# Patient Record
Sex: Female | Born: 1955 | Race: White | Hispanic: No | Marital: Single | Smoking: Never smoker
Health system: Southern US, Community
[De-identification: ages and names within clinical notes are randomized; demographics above are authoritative.]

## PROBLEM LIST (undated history)

## (undated) DIAGNOSIS — Z789 Other specified health status: Secondary | ICD-10-CM

## (undated) DIAGNOSIS — M1990 Unspecified osteoarthritis, unspecified site: Secondary | ICD-10-CM

## (undated) DIAGNOSIS — M8580 Other specified disorders of bone density and structure, unspecified site: Secondary | ICD-10-CM

## (undated) DIAGNOSIS — K635 Polyp of colon: Secondary | ICD-10-CM

## (undated) HISTORY — PX: WISDOM TOOTH EXTRACTION: SHX21

## (undated) HISTORY — PX: JOINT REPLACEMENT: SHX530

## (undated) HISTORY — PX: COLONOSCOPY W/ POLYPECTOMY: SHX1380

---

## 2003-04-15 ENCOUNTER — Other Ambulatory Visit: Admission: RE | Admit: 2003-04-15 | Discharge: 2003-04-15 | Payer: Self-pay | Admitting: Obstetrics and Gynecology

## 2004-06-19 ENCOUNTER — Other Ambulatory Visit: Admission: RE | Admit: 2004-06-19 | Discharge: 2004-06-19 | Payer: Self-pay | Admitting: Obstetrics and Gynecology

## 2005-09-21 ENCOUNTER — Other Ambulatory Visit: Admission: RE | Admit: 2005-09-21 | Discharge: 2005-09-21 | Payer: Self-pay | Admitting: Obstetrics and Gynecology

## 2008-08-10 ENCOUNTER — Ambulatory Visit: Payer: Self-pay | Admitting: Unknown Physician Specialty

## 2009-09-27 ENCOUNTER — Ambulatory Visit: Payer: Self-pay | Admitting: Unknown Physician Specialty

## 2018-09-05 DIAGNOSIS — M8580 Other specified disorders of bone density and structure, unspecified site: Secondary | ICD-10-CM | POA: Insufficient documentation

## 2018-10-04 HISTORY — PX: COLONOSCOPY WITH PROPOFOL: SHX5780

## 2019-02-23 DIAGNOSIS — M1611 Unilateral primary osteoarthritis, right hip: Secondary | ICD-10-CM | POA: Insufficient documentation

## 2019-02-23 DIAGNOSIS — M160 Bilateral primary osteoarthritis of hip: Secondary | ICD-10-CM | POA: Insufficient documentation

## 2019-04-18 NOTE — Discharge Instructions (Signed)
Instructions after Total Hip Replacement ° ° °  Mosi Hannold P. Eliel Dudding, Jr., M.D.    ° Dept. of Orthopaedics & Sports Medicine ° [facility] ° [address] ° [place], [place]  [address] ° Phone: [phone]   Fax: [phone] ° °  °DIET: °• Drink plenty of non-alcoholic fluids. °• Resume your normal diet. Include foods high in fiber. ° °ACTIVITY:  °• You may use crutches or a walker with weight-bearing as tolerated, unless instructed otherwise. °• You may be weaned off of the walker or crutches by your Physical Therapist.  °• Do NOT reach below the level of your knees or cross your legs until allowed.    °• Continue doing gentle exercises. Exercising will reduce the pain and swelling, increase motion, and prevent muscle weakness.   °• Please continue to use the TED compression stockings for 6 weeks. You may remove the stockings at night, but should reapply them in the morning. °• Do not drive or operate any equipment until instructed. ° °WOUND CARE:  °• Continue to use ice packs periodically to reduce pain and swelling. °• Keep the incision clean and dry. °• You may bathe or shower after the staples are removed at the first office visit following surgery. ° °MEDICATIONS: °• You may resume your regular medications. °• Please take the pain medication as prescribed on the medication. °• Do not take pain medication on an empty stomach. °• You have been given a prescription for a blood thinner to prevent blood clots. Please take the medication as instructed. (NOTE: After completing a 2 week course of Lovenox, take one Enteric-coated aspirin once a day.) °• Pain medications and iron supplements can cause constipation. Use a stool softener (Senokot or Colace) on a daily basis and a laxative (dulcolax or miralax) as needed. °• Do not drive or drink alcoholic beverages when taking pain medications. ° °CALL THE OFFICE FOR: °• Temperature above 101 degrees °• Excessive bleeding or drainage on the dressing. °• Excessive  swelling, coldness, or paleness of the toes. °• Persistent nausea and vomiting. ° °FOLLOW-UP:  °• You should have an appointment to return to the office in 6 weeks after surgery. °• Arrangements have been made for continuation of Physical Therapy (either home therapy or outpatient therapy). °  °

## 2019-04-29 ENCOUNTER — Encounter
Admission: RE | Admit: 2019-04-29 | Discharge: 2019-04-29 | Disposition: A | Discharge status: Home / Self Care | Payer: BC Managed Care – PPO | Source: Ambulatory Visit | Attending: Orthopedic Surgery | Admitting: Orthopedic Surgery

## 2019-04-29 ENCOUNTER — Other Ambulatory Visit: Payer: Self-pay

## 2019-04-29 DIAGNOSIS — Z01818 Encounter for other preprocedural examination: Secondary | ICD-10-CM | POA: Insufficient documentation

## 2019-04-29 LAB — URINALYSIS, ROUTINE W REFLEX MICROSCOPIC
Bacteria, UA: NONE SEEN
Bilirubin Urine: NEGATIVE
Glucose, UA: NEGATIVE mg/dL
Hgb urine dipstick: NEGATIVE
Ketones, ur: NEGATIVE mg/dL
Leukocytes,Ua: NEGATIVE
Nitrite: NEGATIVE
Protein, ur: NEGATIVE mg/dL
Specific Gravity, Urine: 1.016 (ref 1.005–1.030)
pH: 6 (ref 5.0–8.0)

## 2019-04-29 LAB — COMPREHENSIVE METABOLIC PANEL
ALT: 21 U/L (ref 0–44)
AST: 24 U/L (ref 15–41)
Albumin: 5 g/dL (ref 3.5–5.0)
Alkaline Phosphatase: 60 U/L (ref 38–126)
Anion gap: 9 (ref 5–15)
BUN: 18 mg/dL (ref 8–23)
CO2: 27 mmol/L (ref 22–32)
Calcium: 9.8 mg/dL (ref 8.9–10.3)
Chloride: 103 mmol/L (ref 98–111)
Creatinine, Ser: 0.76 mg/dL (ref 0.44–1.00)
GFR calc Af Amer: >60 mL/min (ref >60)
GFR calc non Af Amer: >60 mL/min (ref >60)
Glucose, Bld: 102 mg/dL — ABNORMAL HIGH (ref 70–99)
Potassium: 3.7 mmol/L (ref 3.5–5.1)
Sodium: 139 mmol/L (ref 135–145)
Total Bilirubin: 0.5 mg/dL (ref 0.3–1.2)
Total Protein: 7.7 g/dL (ref 6.5–8.1)

## 2019-04-29 LAB — PROTIME-INR
INR: 0.9 (ref 0.8–1.2)
Prothrombin Time: 12.2 seconds (ref 11.4–15.2)

## 2019-04-29 LAB — CBC WITH DIFFERENTIAL/PLATELET
Abs Immature Granulocytes: 0.03 10*3/uL (ref 0.00–0.07)
Basophils Absolute: 0.1 10*3/uL (ref 0.0–0.1)
Basophils Relative: 1 %
Eosinophils Absolute: 0 10*3/uL (ref 0.0–0.5)
Eosinophils Relative: 0 %
HCT: 42.4 % (ref 36.0–46.0)
Hemoglobin: 14 g/dL (ref 12.0–15.0)
Immature Granulocytes: 0 %
Lymphocytes Relative: 24 %
Lymphs Abs: 2.1 10*3/uL (ref 0.7–4.0)
MCH: 29.9 pg (ref 26.0–34.0)
MCHC: 33 g/dL (ref 30.0–36.0)
MCV: 90.6 fL (ref 80.0–100.0)
Monocytes Absolute: 0.5 10*3/uL (ref 0.1–1.0)
Monocytes Relative: 6 %
Neutro Abs: 5.9 10*3/uL (ref 1.7–7.7)
Neutrophils Relative %: 69 %
Platelets: 351 10*3/uL (ref 150–400)
RBC: 4.68 MIL/uL (ref 3.87–5.11)
RDW: 13.1 % (ref 11.5–15.5)
WBC: 8.7 10*3/uL (ref 4.0–10.5)
nRBC: 0 % (ref 0.0–0.2)

## 2019-04-29 LAB — SURGICAL PCR SCREEN
MRSA, PCR: NEGATIVE
Staphylococcus aureus: NEGATIVE

## 2019-04-29 LAB — TYPE AND SCREEN
ABO/RH(D): B POS
Antibody Screen: NEGATIVE

## 2019-04-29 LAB — APTT: aPTT: 31 seconds (ref 24–36)

## 2019-04-29 NOTE — Patient Instructions (Signed)
Your procedure is scheduled on: Wednesday 05/06/19.  Report to DAY SURGERY DEPARTMENT LOCATED ON 2ND FLOOR MEDICAL MALL ENTRANCE. To find out your arrival time please call [phone] between 1PM - 3PM on Tuesday 05/05/19.   Remember: Instructions that are not followed completely may result in serious medical risk, up to and including death, or upon the discretion of your surgeon and anesthesiologist your surgery may need to be rescheduled.      _X__ 1. Do not eat food after midnight the night before your procedure.                 No gum chewing or hard candies. You may drink clear liquids up to 2 hours                 before you are scheduled to arrive for your surgery- DO NOT drink clear                 liquids within 2 hours of the start of your surgery.                 Clear Liquids include:  water, apple juice without pulp, clear carbohydrate                 drink such as Clearfast or Gatorade, Black Coffee or Tea (Do not add                 Milk or creamer to coffee or tea).  **Please finish your Pre-Surgery Ensure 2 hours before your scheduled arrival time.    __X__2.  On the morning of surgery brush your teeth with toothpaste and water, you may rinse your mouth with mouthwash if you wish.  Do not swallow any toothpaste or mouthwash.      __X__3.  Notify your doctor if there is any change in your medical condition      (cold, fever, infections).     Do not wear jewelry, make-up, hairpins, clips or nail polish. Do not wear lotions, powders, or perfumes.  Do not shave 48 hours prior to surgery. Men may shave face and neck. Do not bring valuables to the hospital.    [facility] is not responsible for any belongings or valuables.  Contacts, dentures/partials or body piercings may not be worn into surgery. Bring a case for your contacts, glasses or hearing aids, a denture cup will be supplied.  For patients admitted to the hospital, discharge time is determined by your treatment  team.     Please read over the following fact sheets that you were given:   MRSA Information  __X__ Take these medicines the morning of surgery with A SIP OF WATER:     1. Tylenol if needed is okay to take on the morning of your surgery.  2.   3.   4.  5.  6.    __X__ Use CHG Soap as directed    __X__ Stop Anti-inflammatories 7 days before surgery such as Advil, Ibuprofen, Motrin, BC or Goodies Powder, Naprosyn, Naproxen, Aleve, Aspirin, Meloxicam. May take Tylenol if needed for pain or discomfort.     __X__ Please do not begin taking any herbal supplements until after your procedure.

## 2019-04-30 LAB — URINE CULTURE
Culture: NO GROWTH
Special Requests: NORMAL

## 2019-05-01 ENCOUNTER — Other Ambulatory Visit
Admission: RE | Admit: 2019-05-01 | Discharge: 2019-05-01 | Disposition: A | Discharge status: Home / Self Care | Payer: BC Managed Care – PPO | Source: Ambulatory Visit | Attending: Orthopedic Surgery | Admitting: Orthopedic Surgery

## 2019-05-01 ENCOUNTER — Other Ambulatory Visit: Payer: Self-pay

## 2019-05-01 DIAGNOSIS — Z01812 Encounter for preprocedural laboratory examination: Secondary | ICD-10-CM | POA: Diagnosis present

## 2019-05-01 DIAGNOSIS — Z20828 Contact with and (suspected) exposure to other viral communicable diseases: Secondary | ICD-10-CM | POA: Insufficient documentation

## 2019-05-01 LAB — SEDIMENTATION RATE: Sed Rate: 9 mm/hr (ref 0–30)

## 2019-05-01 LAB — SARS CORONAVIRUS 2 (TAT 6-24 HRS): SARS Coronavirus 2: NEGATIVE

## 2019-05-01 LAB — C-REACTIVE PROTEIN: CRP: <0.8 mg/dL (ref <1.0)

## 2019-05-05 ENCOUNTER — Encounter: Payer: Self-pay | Admitting: Orthopedic Surgery

## 2019-05-05 MED ORDER — TRANEXAMIC ACID-NACL 1000-0.7 MG/100ML-% IV SOLN
1000.0000 mg | INTRAVENOUS | Status: DC
  Administered 2019-05-06: 1000 mg via INTRAVENOUS
  Filled 2019-05-05: qty 100

## 2019-05-05 MED ORDER — CEFAZOLIN SODIUM-DEXTROSE 2-4 GM/100ML-% IV SOLN: 2.0000 g | INTRAVENOUS | Status: DC

## 2019-05-06 ENCOUNTER — Inpatient Hospital Stay: Payer: BC Managed Care – PPO | Admitting: Certified Registered"

## 2019-05-06 ENCOUNTER — Other Ambulatory Visit: Payer: Self-pay

## 2019-05-06 ENCOUNTER — Encounter
Admission: RE | Disposition: A | Discharge status: Home / Self Care | Payer: Self-pay | Source: Home / Self Care | Attending: Orthopedic Surgery

## 2019-05-06 ENCOUNTER — Observation Stay
Admission: RE | Admit: 2019-05-06 | Discharge: 2019-05-08 | Disposition: A | Discharge status: Home / Self Care | Payer: BC Managed Care – PPO | Attending: Orthopedic Surgery | Admitting: Orthopedic Surgery

## 2019-05-06 ENCOUNTER — Inpatient Hospital Stay: Payer: BC Managed Care – PPO

## 2019-05-06 DIAGNOSIS — M25552 Pain in left hip: Secondary | ICD-10-CM | POA: Diagnosis present

## 2019-05-06 DIAGNOSIS — Z96642 Presence of left artificial hip joint: Secondary | ICD-10-CM

## 2019-05-06 DIAGNOSIS — M1612 Unilateral primary osteoarthritis, left hip: Principal | ICD-10-CM | POA: Insufficient documentation

## 2019-05-06 DIAGNOSIS — Z96649 Presence of unspecified artificial hip joint: Secondary | ICD-10-CM

## 2019-05-06 HISTORY — PX: TOTAL HIP ARTHROPLASTY: SHX124

## 2019-05-06 LAB — ABO/RH: ABO/RH(D): B POS

## 2019-05-06 SURGERY — ARTHROPLASTY, HIP, TOTAL,POSTERIOR APPROACH
Anesthesia: Spinal | Site: Hip | Laterality: Left

## 2019-05-06 MED ORDER — FENTANYL CITRATE (PF) 100 MCG/2ML IJ SOLN
INTRAMUSCULAR | Status: AC
  Administered 2019-05-06: 25 ug via INTRAVENOUS
  Filled 2019-05-06: qty 2

## 2019-05-06 MED ORDER — CEFAZOLIN SODIUM-DEXTROSE 2-4 GM/100ML-% IV SOLN
2.0000 g | Freq: Four times a day (QID) | INTRAVENOUS | Status: DC
  Administered 2019-05-06 (×2): 2 g via INTRAVENOUS
  Filled 2019-05-06 (×2): qty 100

## 2019-05-06 MED ORDER — BSS IO SOLN: 15.0000 mL | Freq: Once | INTRAOCULAR | Status: DC

## 2019-05-06 MED ORDER — PROPOFOL 10 MG/ML IV BOLUS
INTRAVENOUS | Status: DC | PRN
  Administered 2019-05-06: 30 mg via INTRAVENOUS

## 2019-05-06 MED ORDER — OXYCODONE HCL 5 MG PO TABS
5.0000 mg | ORAL_TABLET | ORAL | Status: DC | PRN
  Administered 2019-05-07: 5 mg via ORAL
  Filled 2019-05-06 (×2): qty 1

## 2019-05-06 MED ORDER — PROPOFOL 500 MG/50ML IV EMUL
INTRAVENOUS | Status: AC
  Filled 2019-05-06: qty 50

## 2019-05-06 MED ORDER — MIDAZOLAM HCL 5 MG/5ML IJ SOLN
INTRAMUSCULAR | Status: DC | PRN
  Administered 2019-05-06 (×2): 1 mg via INTRAVENOUS

## 2019-05-06 MED ORDER — ADULT MULTIVITAMIN W/MINERALS CH
1.0000 | ORAL_TABLET | Freq: Every day | ORAL | Status: DC
  Administered 2019-05-07 – 2019-05-08 (×2): 1 via ORAL
  Filled 2019-05-06 (×2): qty 1

## 2019-05-06 MED ORDER — BISACODYL 10 MG RE SUPP
10.0000 mg | Freq: Every day | RECTAL | Status: DC | PRN
  Administered 2019-05-08: 10 mg via RECTAL
  Filled 2019-05-06 (×2): qty 1

## 2019-05-06 MED ORDER — GABAPENTIN 300 MG PO CAPS
ORAL_CAPSULE | ORAL | Status: AC
  Administered 2019-05-06: 300 mg via ORAL
  Filled 2019-05-06: qty 1

## 2019-05-06 MED ORDER — ONDANSETRON HCL 4 MG/2ML IJ SOLN: 4.0000 mg | Freq: Four times a day (QID) | INTRAMUSCULAR | Status: DC | PRN

## 2019-05-06 MED ORDER — CEFAZOLIN SODIUM-DEXTROSE 2-4 GM/100ML-% IV SOLN
INTRAVENOUS | Status: AC
  Filled 2019-05-06: qty 100

## 2019-05-06 MED ORDER — FAMOTIDINE 20 MG PO TABS
ORAL_TABLET | ORAL | Status: AC
  Administered 2019-05-06: 20 mg via ORAL
  Filled 2019-05-06: qty 1

## 2019-05-06 MED ORDER — ACETAMINOPHEN 10 MG/ML IV SOLN
1000.0000 mg | Freq: Four times a day (QID) | INTRAVENOUS | Status: AC
  Administered 2019-05-06 – 2019-05-07 (×4): 1000 mg via INTRAVENOUS
  Filled 2019-05-06 (×3): qty 100

## 2019-05-06 MED ORDER — ONDANSETRON HCL 4 MG PO TABS: 4.0000 mg | ORAL_TABLET | Freq: Four times a day (QID) | ORAL | Status: DC | PRN

## 2019-05-06 MED ORDER — FENTANYL CITRATE (PF) 100 MCG/2ML IJ SOLN
25.0000 ug | INTRAMUSCULAR | Status: DC | PRN
  Administered 2019-05-06 (×5): 25 ug via INTRAVENOUS

## 2019-05-06 MED ORDER — CELECOXIB 200 MG PO CAPS
400.0000 mg | ORAL_CAPSULE | Freq: Once | ORAL | Status: AC
  Administered 2019-05-06: 08:00:00 400 mg via ORAL

## 2019-05-06 MED ORDER — PROPOFOL 10 MG/ML IV BOLUS
INTRAVENOUS | Status: AC
  Filled 2019-05-06: qty 20

## 2019-05-06 MED ORDER — ENSURE PRE-SURGERY PO LIQD
296.0000 mL | Freq: Once | ORAL | Status: DC
  Filled 2019-05-06: qty 296

## 2019-05-06 MED ORDER — DEXAMETHASONE SODIUM PHOSPHATE 10 MG/ML IJ SOLN
INTRAMUSCULAR | Status: AC
  Administered 2019-05-06: 8 mg via INTRAVENOUS
  Filled 2019-05-06: qty 1

## 2019-05-06 MED ORDER — DEXAMETHASONE SODIUM PHOSPHATE 10 MG/ML IJ SOLN
8.0000 mg | Freq: Once | INTRAMUSCULAR | Status: AC
  Administered 2019-05-06: 08:00:00 8 mg via INTRAVENOUS

## 2019-05-06 MED ORDER — FLEET ENEMA 7-19 GM/118ML RE ENEM: 1.0000 | ENEMA | Freq: Once | RECTAL | Status: DC | PRN

## 2019-05-06 MED ORDER — LACTATED RINGERS IV SOLN
INTRAVENOUS | Status: DC
  Administered 2019-05-06: 08:00:00 via INTRAVENOUS

## 2019-05-06 MED ORDER — HYDROMORPHONE HCL 1 MG/ML IJ SOLN: 0.5000 mg | INTRAMUSCULAR | Status: DC | PRN

## 2019-05-06 MED ORDER — PHENOL 1.4 % MT LIQD
1.0000 | OROMUCOSAL | Status: DC | PRN
  Filled 2019-05-06: qty 177

## 2019-05-06 MED ORDER — DIPHENHYDRAMINE HCL 12.5 MG/5ML PO ELIX
12.5000 mg | ORAL_SOLUTION | ORAL | Status: DC | PRN
  Filled 2019-05-06: qty 10

## 2019-05-06 MED ORDER — BUPIVACAINE HCL (PF) 0.5 % IJ SOLN
INTRAMUSCULAR | Status: DC | PRN
  Administered 2019-05-06: 3 mL

## 2019-05-06 MED ORDER — CELECOXIB 200 MG PO CAPS
ORAL_CAPSULE | ORAL | Status: AC
  Administered 2019-05-06: 400 mg via ORAL
  Filled 2019-05-06: qty 2

## 2019-05-06 MED ORDER — TRANEXAMIC ACID-NACL 1000-0.7 MG/100ML-% IV SOLN
1000.0000 mg | Freq: Once | INTRAVENOUS | Status: AC
  Administered 2019-05-06: 09:00:00 1000 mg via INTRAVENOUS
  Filled 2019-05-06: qty 100

## 2019-05-06 MED ORDER — MAGNESIUM HYDROXIDE 400 MG/5ML PO SUSP
30.0000 mL | Freq: Every day | ORAL | Status: DC
  Administered 2019-05-07: 30 mL via ORAL
  Filled 2019-05-06 (×2): qty 30

## 2019-05-06 MED ORDER — ENOXAPARIN SODIUM 30 MG/0.3ML ~~LOC~~ SOLN
30.0000 mg | Freq: Two times a day (BID) | SUBCUTANEOUS | Status: DC
  Administered 2019-05-07 – 2019-05-08 (×3): 30 mg via SUBCUTANEOUS
  Filled 2019-05-06 (×3): qty 0.3

## 2019-05-06 MED ORDER — FAMOTIDINE 20 MG PO TABS
20.0000 mg | ORAL_TABLET | Freq: Once | ORAL | Status: AC
  Administered 2019-05-06: 08:00:00 20 mg via ORAL

## 2019-05-06 MED ORDER — MIDAZOLAM HCL 2 MG/2ML IJ SOLN
INTRAMUSCULAR | Status: AC
  Filled 2019-05-06: qty 2

## 2019-05-06 MED ORDER — CHLORHEXIDINE GLUCONATE 4 % EX LIQD: 60.0000 mL | Freq: Once | CUTANEOUS | Status: DC

## 2019-05-06 MED ORDER — SODIUM CHLORIDE 0.9 % IV SOLN
INTRAVENOUS | Status: DC
  Administered 2019-05-06 – 2019-05-07 (×2): via INTRAVENOUS

## 2019-05-06 MED ORDER — MENTHOL 3 MG MT LOZG
1.0000 | LOZENGE | OROMUCOSAL | Status: DC | PRN
  Filled 2019-05-06: qty 9

## 2019-05-06 MED ORDER — KETOROLAC TROMETHAMINE 0.5 % OP SOLN
1.0000 [drp] | Freq: Three times a day (TID) | OPHTHALMIC | Status: DC | PRN
  Filled 2019-05-06: qty 3

## 2019-05-06 MED ORDER — CEFAZOLIN SODIUM-DEXTROSE 2-4 GM/100ML-% IV SOLN
2.0000 g | Freq: Four times a day (QID) | INTRAVENOUS | Status: AC
  Administered 2019-05-06 – 2019-05-07 (×2): 2 g via INTRAVENOUS
  Filled 2019-05-06 (×3): qty 100

## 2019-05-06 MED ORDER — GABAPENTIN 300 MG PO CAPS
300.0000 mg | ORAL_CAPSULE | Freq: Once | ORAL | Status: AC
  Administered 2019-05-06: 08:00:00 300 mg via ORAL

## 2019-05-06 MED ORDER — CELECOXIB 200 MG PO CAPS
200.0000 mg | ORAL_CAPSULE | Freq: Two times a day (BID) | ORAL | Status: DC
  Administered 2019-05-06 – 2019-05-08 (×4): 200 mg via ORAL
  Filled 2019-05-06 (×4): qty 1

## 2019-05-06 MED ORDER — ALUM & MAG HYDROXIDE-SIMETH 200-200-20 MG/5ML PO SUSP: 30.0000 mL | ORAL | Status: DC | PRN

## 2019-05-06 MED ORDER — OXYCODONE HCL 5 MG PO TABS
10.0000 mg | ORAL_TABLET | ORAL | Status: DC | PRN
  Filled 2019-05-06: qty 2

## 2019-05-06 MED ORDER — FERROUS SULFATE 325 (65 FE) MG PO TABS
325.0000 mg | ORAL_TABLET | Freq: Two times a day (BID) | ORAL | Status: DC
  Administered 2019-05-07 – 2019-05-08 (×3): 325 mg via ORAL
  Filled 2019-05-06 (×3): qty 1

## 2019-05-06 MED ORDER — FENTANYL CITRATE (PF) 100 MCG/2ML IJ SOLN
INTRAMUSCULAR | Status: AC
  Filled 2019-05-06: qty 2

## 2019-05-06 MED ORDER — PANTOPRAZOLE SODIUM 40 MG PO TBEC
40.0000 mg | DELAYED_RELEASE_TABLET | Freq: Two times a day (BID) | ORAL | Status: DC
  Administered 2019-05-06 – 2019-05-08 (×4): 40 mg via ORAL
  Filled 2019-05-06 (×4): qty 1

## 2019-05-06 MED ORDER — ACETAMINOPHEN 10 MG/ML IV SOLN
INTRAVENOUS | Status: AC
  Administered 2019-05-06: 1000 mg via INTRAVENOUS
  Filled 2019-05-06: qty 100

## 2019-05-06 MED ORDER — METOCLOPRAMIDE HCL 5 MG/ML IJ SOLN: 5.0000 mg | Freq: Three times a day (TID) | INTRAMUSCULAR | Status: DC | PRN

## 2019-05-06 MED ORDER — KETOROLAC TROMETHAMINE 0.5 % OP SOLN
1.0000 [drp] | Freq: Three times a day (TID) | OPHTHALMIC | Status: AC | PRN
  Administered 2019-05-06 – 2019-05-07 (×3): 1 [drp] via OPHTHALMIC
  Filled 2019-05-06: qty 3

## 2019-05-06 MED ORDER — BUPIVACAINE HCL (PF) 0.5 % IJ SOLN
INTRAMUSCULAR | Status: AC
  Filled 2019-05-06: qty 10

## 2019-05-06 MED ORDER — NEOMYCIN-POLYMYXIN B GU 40-200000 IR SOLN
Status: DC | PRN
  Administered 2019-05-06: 16 mL

## 2019-05-06 MED ORDER — PHENYLEPHRINE HCL (PRESSORS) 10 MG/ML IV SOLN
INTRAVENOUS | Status: DC | PRN
  Administered 2019-05-06 (×2): 100 ug via INTRAVENOUS

## 2019-05-06 MED ORDER — ONDANSETRON HCL 4 MG/2ML IJ SOLN: 4.0000 mg | Freq: Once | INTRAMUSCULAR | Status: DC | PRN

## 2019-05-06 MED ORDER — PROPOFOL 500 MG/50ML IV EMUL
INTRAVENOUS | Status: DC | PRN
  Administered 2019-05-06: 50 ug/kg/min via INTRAVENOUS

## 2019-05-06 MED ORDER — GABAPENTIN 300 MG PO CAPS
300.0000 mg | ORAL_CAPSULE | Freq: Every day | ORAL | Status: DC
  Administered 2019-05-06 – 2019-05-07 (×2): 300 mg via ORAL
  Filled 2019-05-06 (×2): qty 1

## 2019-05-06 MED ORDER — FENTANYL CITRATE (PF) 100 MCG/2ML IJ SOLN
INTRAMUSCULAR | Status: DC | PRN
  Administered 2019-05-06 (×2): 50 ug via INTRAVENOUS

## 2019-05-06 MED ORDER — ACETAMINOPHEN 325 MG PO TABS: 325.0000 mg | ORAL_TABLET | Freq: Four times a day (QID) | ORAL | Status: DC | PRN

## 2019-05-06 MED ORDER — TRAMADOL HCL 50 MG PO TABS
50.0000 mg | ORAL_TABLET | ORAL | Status: DC | PRN
  Administered 2019-05-06: 21:00:00 100 mg via ORAL
  Administered 2019-05-08: 50 mg via ORAL
  Filled 2019-05-06: qty 1
  Filled 2019-05-06 (×2): qty 2

## 2019-05-06 MED ORDER — METOCLOPRAMIDE HCL 10 MG PO TABS: 5.0000 mg | ORAL_TABLET | Freq: Three times a day (TID) | ORAL | Status: DC | PRN

## 2019-05-06 MED ORDER — SENNOSIDES-DOCUSATE SODIUM 8.6-50 MG PO TABS
1.0000 | ORAL_TABLET | Freq: Two times a day (BID) | ORAL | Status: DC
  Administered 2019-05-06 – 2019-05-08 (×4): 1 via ORAL
  Filled 2019-05-06 (×4): qty 1

## 2019-05-06 MED ORDER — SODIUM CHLORIDE 0.9 % IV SOLN: INTRAVENOUS | Status: DC | PRN

## 2019-05-06 MED ORDER — FENTANYL CITRATE (PF) 100 MCG/2ML IJ SOLN
INTRAMUSCULAR | Status: AC
  Administered 2019-05-06: 12:00:00 25 ug via INTRAVENOUS
  Filled 2019-05-06: qty 2

## 2019-05-06 MED ORDER — METOCLOPRAMIDE HCL 10 MG PO TABS
10.0000 mg | ORAL_TABLET | Freq: Three times a day (TID) | ORAL | Status: DC
  Administered 2019-05-06 – 2019-05-08 (×7): 10 mg via ORAL
  Filled 2019-05-06 (×8): qty 1

## 2019-05-06 SURGICAL SUPPLY — 60 items
BLADE DRUM FLTD (BLADE) ×3 IMPLANT
BLADE SAW 90X25X1.19 OSCILLAT (BLADE) ×3 IMPLANT
CANISTER SUCT 1200ML W/VALVE (MISCELLANEOUS) ×3 IMPLANT
CANISTER SUCT 3000ML PPV (MISCELLANEOUS) ×6 IMPLANT
CARTRIDGE OIL MAESTRO DRILL (MISCELLANEOUS) ×1 IMPLANT
COVER WAND RF STERILE (DRAPES) ×3 IMPLANT
CUP ACET PINNACLE SECTR 48MM (Joint) ×1 IMPLANT
DIFFUSER DRILL AIR PNEUMATIC (MISCELLANEOUS) ×3 IMPLANT
DRAPE 3/4 80X56 (DRAPES) ×3 IMPLANT
DRAPE INCISE IOBAN 66X60 STRL (DRAPES) ×3 IMPLANT
DRSG DERMACEA 8X12 NADH (GAUZE/BANDAGES/DRESSINGS) ×3 IMPLANT
DRSG OPSITE POSTOP 4X12 (GAUZE/BANDAGES/DRESSINGS) ×3 IMPLANT
DRSG OPSITE POSTOP 4X14 (GAUZE/BANDAGES/DRESSINGS) IMPLANT
DRSG TEGADERM 4X4.75 (GAUZE/BANDAGES/DRESSINGS) ×3 IMPLANT
DURAPREP 26ML APPLICATOR (WOUND CARE) ×3 IMPLANT
ELECT REM PT RETURN 9FT ADLT (ELECTROSURGICAL) ×3
ELECTRODE REM PT RTRN 9FT ADLT (ELECTROSURGICAL) ×1 IMPLANT
GLOVE BIOGEL M STRL SZ7.5 (GLOVE) ×6 IMPLANT
GLOVE BIOGEL PI IND STRL 7.5 (GLOVE) ×5 IMPLANT
GLOVE BIOGEL PI INDICATOR 7.5 (GLOVE) ×10
GLOVE INDICATOR 8.0 STRL GRN (GLOVE) ×3 IMPLANT
GOWN STRL REUS W/ TWL LRG LVL3 (GOWN DISPOSABLE) ×2 IMPLANT
GOWN STRL REUS W/TWL LRG LVL3 (GOWN DISPOSABLE) ×4
HEAD FEM STD 32X+1 STRL (Hips) ×3 IMPLANT
HEMOVAC 400CC 10FR (MISCELLANEOUS) ×3 IMPLANT
HOLDER FOLEY CATH W/STRAP (MISCELLANEOUS) ×3 IMPLANT
HOOD PEEL AWAY FLYTE STAYCOOL (MISCELLANEOUS) ×9 IMPLANT
KIT TURNOVER KIT A (KITS) ×3 IMPLANT
LINER MAR 4 10 32X48 (Hips) ×2 IMPLANT
LINER MARATHON 4 10 32X48 (Hips) ×1 IMPLANT
MANIFOLD NEPTUNE II (INSTRUMENTS) ×3 IMPLANT
NDL SAFETY ECLIPSE 18X1.5 (NEEDLE) ×1 IMPLANT
NEEDLE HYPO 18GX1.5 SHARP (NEEDLE) ×2
NS IRRIG 500ML POUR BTL (IV SOLUTION) ×3 IMPLANT
OIL CARTRIDGE MAESTRO DRILL (MISCELLANEOUS) ×3
PACK HIP PROSTHESIS (MISCELLANEOUS) ×3 IMPLANT
PENCIL SMOKE ULTRAEVAC 22 CON (MISCELLANEOUS) ×3 IMPLANT
PIN STEIN THRED 5/32 (Pin) ×3 IMPLANT
PINNSECTOR W/GRIP ACE CUP 48MM (Joint) ×3 IMPLANT
PULSAVAC PLUS IRRIG FAN TIP (DISPOSABLE) ×3
SOL .9 NS 3000ML IRR  AL (IV SOLUTION) ×2
SOL .9 NS 3000ML IRR UROMATIC (IV SOLUTION) ×1 IMPLANT
SOL PREP PVP 2OZ (MISCELLANEOUS) ×3
SOLUTION PREP PVP 2OZ (MISCELLANEOUS) ×1 IMPLANT
SPONGE DRAIN TRACH 4X4 STRL 2S (GAUZE/BANDAGES/DRESSINGS) ×3 IMPLANT
STAPLER SKIN PROX 35W (STAPLE) ×3 IMPLANT
STEM FEM CMNTLSS SM AML 13.5 (Hips) ×3 IMPLANT
SUCTION FRAZIER HANDLE 10FR (MISCELLANEOUS) ×2
SUCTION TUBE FRAZIER 10FR DISP (MISCELLANEOUS) ×1 IMPLANT
SUT ETHIBOND #5 BRAIDED 30INL (SUTURE) ×3 IMPLANT
SUT VIC AB 0 CT1 36 (SUTURE) ×3 IMPLANT
SUT VIC AB 1 CT1 36 (SUTURE) ×6 IMPLANT
SUT VIC AB 2-0 CT1 27 (SUTURE) ×3
SUT VIC AB 2-0 CT1 TAPERPNT 27 (SUTURE) ×1 IMPLANT
SYR 20ML LL LF (SYRINGE) ×3 IMPLANT
TAPE CLOTH 3X10 WHT NS LF (GAUZE/BANDAGES/DRESSINGS) ×3 IMPLANT
TAPE TRANSPORE STRL 2 31045 (GAUZE/BANDAGES/DRESSINGS) ×3 IMPLANT
TIP FAN IRRIG PULSAVAC PLUS (DISPOSABLE) ×1 IMPLANT
TOWEL OR 17X26 4PK STRL BLUE (TOWEL DISPOSABLE) ×3 IMPLANT
TRAY FOLEY MTR SLVR 16FR STAT (SET/KITS/TRAYS/PACK) ×3 IMPLANT

## 2019-05-06 NOTE — TOC Benefit Eligibility Note (Signed)
Transition of Care [facility]) Benefit Eligibility Note    Patient Details  Name: Alicia Calderon MRN: [id number] Date of Birth: April 28, 1956   Medication/Dose: Enoxaparin 40 mg daily x 14 days  Covered?: (Generic - Enoxaparin)     Prescription Coverage Preferred Pharmacy: [facility]  Spoke with Person/Company/Phone Number:: Cydney Ok of [place], [place]  Co-Pay: $10     Deductible: Unmet  Additional Notes: Lovenox not on formulary.    [place] Phone Number: 05/06/2019, 3:50 PM

## 2019-05-06 NOTE — Progress Notes (Signed)
OT Cancellation Note  Patient Details Name: Alicia Calderon MRN: [id number] DOB: October 15, 1955   Cancelled Treatment:    Reason Eval/Treat Not Completed: Other (comment);Patient at procedure or test/ unavailable Thank you for the OT consult. Order received and chart reviewed. Pt noted to be currently off the unit for surgery. Will follow acutely and initiate OT services POD#1 as available and pt medically appropriate for OT evaluation, per OT protocols.   Shara Blazing, M.S., OTR/L Ascom: [phone] 05/06/19, 10:47 AM

## 2019-05-06 NOTE — Evaluation (Signed)
Physical Therapy Evaluation Patient Details Name: Alicia Calderon MRN: [id number] DOB: [birth year] Today's Date: 05/06/2019   History of Present Illness  Patient is 62 female s/p L THA, posterior hip approach, WBAT. PMH unremarkable.  Clinical Impression  Patient alert, with complaints of L eye pain, 7/10 L hip pain, agreeable to PT, return of sensation noted, pt able to DF/PF. Patient reported she will be staying at her mother's house after hospital stay. Previously independent, no falls in the last 6 months.  The patient demonstrated LE therapeutic exercises with tactile cues. PT educated pt on posterior hip precautions, pt expressed understanding but will need further instruction. Supine to sit minA for LE management, sit <> stand with RW, CGA, and verbal cues for handplacement. The patient ambulated ~43ft to the recliner, instructed in step to gait pattern, use of RW, and increased UE weight bearing noted.  Overall the patient demonstrated deficits (see "PT Problem List") that impede the patient's functional abilities, safety, and mobility and would benefit from skilled PT intervention. Recommendation is HHPT with supervision for mobility/OOB.       Follow Up Recommendations Home health PT;Supervision for mobility/OOB    Equipment Recommendations  None recommended by PT;Other (comment)(Pt has RW at home)    Recommendations for Other Services       Precautions / Restrictions Precautions Precautions: Fall Restrictions Weight Bearing Restrictions: Yes LLE Weight Bearing: Weight bearing as tolerated      Mobility  Bed Mobility Overal bed mobility: Needs Assistance Bed Mobility: Supine to Sit     Supine to sit: Min assist;HOB elevated     General bed mobility comments: minA for LE assist  Transfers Overall transfer level: Needs assistance Equipment used: Rolling walker (2 wheeled) Transfers: Sit to/from Stand Sit to Stand: Min guard             Ambulation/Gait Ambulation/Gait assistance: Min guard Gait Distance (Feet): 3 Feet Assistive device: Rolling walker (2 wheeled) Gait Pattern/deviations: Step-to pattern     General Gait Details: instructed in step to gait pattern, pt slow, no unsteadiness noted, heavy use of UE  Stairs            Wheelchair Mobility    Modified Rankin (Stroke Patients Only)       Balance Overall balance assessment: Needs assistance Sitting-balance support: Feet supported Sitting balance-Leahy Scale: Fair       Standing balance-Leahy Scale: Poor                               Pertinent Vitals/Pain Pain Assessment: 0-10 Pain Score: 7  Pain Location: L posterior hip Pain Descriptors / Indicators: Tender;Grimacing;Constant;Aching Pain Intervention(s): Limited activity within patient's tolerance;Monitored during session;Repositioned;Ice applied    Home Living Family/patient expects to be discharged to:: Private residence Living Arrangements: Parent Available Help at Discharge: Family Type of Home: House Home Access: Stairs to enter Entrance Stairs-Rails: None Entrance Stairs-Number of Steps: 1-2 Home Layout: One level Home Equipment: Environmental consultant - 2 wheels;Cane - single point;Bedside commode;Hospital bed      Prior Function Level of Independence: Independent               Hand Dominance   Dominant Hand: Right    Extremity/Trunk Assessment   Upper Extremity Assessment Upper Extremity Assessment: Overall WFL for tasks assessed    Lower Extremity Assessment Lower Extremity Assessment: RLE deficits/detail;LLE deficits/detail RLE Deficits / Details: WFLs LLE Deficits / Details: s/p THA  Cervical / Trunk Assessment Cervical / Trunk Assessment: Normal  Communication   Communication: No difficulties  Cognition Arousal/Alertness: Awake/alert Behavior During Therapy: WFL for tasks assessed/performed Overall Cognitive Status: Within Functional Limits for  tasks assessed                                        General Comments      Exercises Total Joint Exercises Ankle Circles/Pumps: AROM;Both;10 reps Quad Sets: AROM;Both;10 reps;Strengthening Short Arc Quad: AROM;Left;10 reps;Strengthening Hip ABduction/ADduction: AROM;Strengthening;Left;10 reps   Assessment/Plan    PT Assessment Patient needs continued PT services  PT Problem List Decreased strength;Decreased mobility;Decreased safety awareness;Decreased range of motion;Decreased knowledge of precautions;Decreased activity tolerance;Decreased balance;Decreased knowledge of use of DME;Pain       PT Treatment Interventions DME instruction;Therapeutic exercise;Balance training;Gait training;Stair training;Neuromuscular re-education;Functional mobility training;Therapeutic activities;Patient/family education    PT Goals (Current goals can be found in the Care Plan section)  Acute Rehab PT Goals Patient Stated Goal: to go home PT Goal Formulation: With patient Time For Goal Achievement: 05/20/19 Potential to Achieve Goals: Good    Frequency BID   Barriers to discharge        Co-evaluation               AM-PAC PT "6 Clicks" Mobility  Outcome Measure Help needed turning from your back to your side while in a flat bed without using bedrails?: A Lot Help needed moving from lying on your back to sitting on the side of a flat bed without using bedrails?: A Lot Help needed moving to and from a bed to a chair (including a wheelchair)?: A Little Help needed standing up from a chair using your arms (e.g., wheelchair or bedside chair)?: A Little Help needed to walk in hospital room?: A Little   6 Click Score: 13    End of Session Equipment Utilized During Treatment: Gait belt Activity Tolerance: Patient tolerated treatment well Patient left: in chair;with chair alarm set;with call bell/phone within reach;with SCD's reapplied(pillows placed to prevent hip  IR/adduction) Nurse Communication: Mobility status PT Visit Diagnosis: Unsteadiness on feet (R26.81);Muscle weakness (generalized) (M62.81);Difficulty in walking, not elsewhere classified (R26.2);Other abnormalities of gait and mobility (R26.89);Pain Pain - Right/Left: Left Pain - part of body: Hip    Time: [id number] PT Time Calculation (min) (ACUTE ONLY): 42 min   Charges:   PT Evaluation $PT Eval Low Complexity: 1 Low PT Treatments $Therapeutic Exercise: 23-37 mins        Olga Coasteriana Wilmary Levit PT, DPT 4:16 PM,05/06/19 [phone]'

## 2019-05-06 NOTE — Anesthesia Procedure Notes (Signed)
Spinal  Patient location during procedure: OR Start time: 05/06/2019 8:35 AM Staffing Anesthesiologist: Molli Barrows, MD Performed: anesthesiologist  Preanesthetic Checklist Completed: patient identified, site marked, surgical consent, pre-op evaluation, timeout performed, IV checked, risks and benefits discussed and monitors and equipment checked Spinal Block Patient position: sitting Prep: DuraPrep Patient monitoring: heart rate, cardiac monitor, continuous pulse ox and blood pressure Approach: midline Location: L3-4 Injection technique: single-shot Needle Needle type: Sprotte  Needle gauge: 24 G Needle length: 9 cm Assessment Sensory level: T4 Additional Notes 1st attempt per CRNA unable to find SA space, only bony prominences.  2nd attempt per MD w/ cutting needle successful SA space entrance w/ CSF return.

## 2019-05-06 NOTE — Op Note (Signed)
OPERATIVE NOTE  DATE OF SURGERY:  05/06/2019  PATIENT NAME:  Alicia Calderon   DOB: 11/11/1955  MRN: [id number]  PRE-OPERATIVE DIAGNOSIS: Degenerative arthrosis of the left hip, primary  POST-OPERATIVE DIAGNOSIS:  Same  PROCEDURE:  Left total hip arthroplasty  SURGEON:  Jena GaussJames P Lisle Skillman, Jr. M.D.  ASSISTANT: Dedra Skeensodd Mundy, PA-C (present and scrubbed throughout the case, critical for assistance with exposure, retraction, instrumentation, and closure)  ANESTHESIA: spinal  ESTIMATED BLOOD LOSS: 50 mL  FLUIDS REPLACED: 1200 mL of crystalloid  DRAINS: 2 medium drains to a Hemovac reservoir  IMPLANTS UTILIZED: DePuy 13.5 mm small stature AML femoral stem, 48 mm OD Pinnacle Gription Sector acetabular component, +4 mm 10 degree Pinnacle Marathon polyethylene insert, and a 32 mm CoCr +1 mm hip ball  INDICATIONS FOR SURGERY: Alicia Calderon is a 63 y.o. year old female with a long history of progressive hip and groin  pain. X-rays demonstrated severe degenerative changes. The patient had not seen any significant improvement despite conservative nonsurgical intervention. After discussion of the risks and benefits of surgical intervention, the patient expressed understanding of the risks benefits and agree with plans for total hip arthroplasty.   The risks, benefits, and alternatives were discussed at length including but not limited to the risks of infection, bleeding, nerve injury, stiffness, blood clots, the need for revision surgery, limb length inequality, dislocation, cardiopulmonary complications, among others, and they were willing to proceed.  PROCEDURE IN DETAIL: The patient was brought into the operating room and, after adequate spinal anesthesia was achieved, the patient was placed in a right lateral decubitus position. Axillary roll was placed and all bony prominences were well-padded. The patient's left hip was cleaned and prepped with alcohol and DuraPrep and draped in the usual sterile  fashion. A "timeout" was performed as per usual protocol. A lateral curvilinear incision was made gently curving towards the posterior superior iliac spine. The IT band was incised in line with the skin incision and the fibers of the gluteus maximus were split in line. The piriformis tendon was identified, skeletonized, and incised at its insertion to the proximal femur and reflected posteriorly. A T type posterior capsulotomy was performed. Prior to dislocation of the femoral head, a threaded Steinmann pin was inserted through a separate stab incision into the pelvis superior to the acetabulum and bent in the form of a stylus so as to assess limb length and hip offset throughout the procedure. The femoral head was then dislocated posteriorly. Inspection of the femoral head demonstrated severe degenerative changes with full-thickness loss of articular cartilage. The femoral neck cut was performed using an oscillating saw. The anterior capsule was elevated off of the femoral neck using a periosteal elevator. Attention was then directed to the acetabulum. The remnant of the labrum was excised using electrocautery. Inspection of the acetabulum also demonstrated significant degenerative changes. The acetabulum was reamed in sequential fashion up to a 47 mm diameter. Good punctate bleeding bone was encountered. A 48 mm Pinnacle Gription Sector acetabular component was positioned and impacted into place. Good scratch fit was appreciated. A +4 mm polyethylene trial was inserted.  Attention was then directed to the proximal femur. A hole for reaming of the proximal femoral canal was created using a high-speed burr. The femoral canal was reamed in sequential fashion up to a 13 mm diameter. This allowed for approximately 6 cm of scratch fit. Serial broaches were inserted up to a 13.5 mm small stature femoral broach. Calcar region was planed and  a trial reduction was performed using a 32 mm hip ball with a +1 mm neck length.   Reasonably good stability was appreciated but it was elected to trial with a +4 mm 10 degree trial with the high side positioned at the 4 o'clock position good equalization of limb lengths and hip offset was appreciated and excellent stability was noted both anteriorly and posteriorly. Trial components were removed. The acetabular shell was irrigated with copious amounts of normal saline with antibiotic solution and suctioned dry. A +4 mm 10 degree Pinnacle Marathon polyethylene insert was positioned with the high side at the 4 o'clock position and impacted into place. Next, a 13.5 mm small stature AML femoral stem was positioned and impacted into place. Excellent scratch fit was appreciated. A trial reduction was again performed with a 32 mm hip ball with a +1 mm neck length. Again, good equalization of limb lengths was appreciated and excellent stability appreciated both anteriorly and posteriorly. The hip was then dislocated and the trial hip ball was removed. The Morse taper was cleaned and dried. A 32 mm cobalt chromium hip ball with a +1 mm neck length was placed on the trunnion and impacted into place. The hip was then reduced and placed through range of motion. Excellent stability was appreciated both anteriorly and posteriorly.  The wound was irrigated with copious amounts of normal saline with antibiotic solution and suctioned dry. Good hemostasis was appreciated. The posterior capsulotomy was repaired using #5 Ethibond. Piriformis tendon was reapproximated to the undersurface of the gluteus medius tendon using #5 Ethibond. Two medium drains were placed in the wound bed and brought out through separate stab incisions to be attached to a Hemovac reservoir. The IT band was reapproximated using interrupted sutures of #1 Vicryl. Subcutaneous tissue was approximated using first #0 Vicryl followed by #2-0 Vicryl. The skin was closed with skin staples.  The patient tolerated the procedure well and was  transported to the recovery room in stable condition.   Marciano Sequin., M.D.

## 2019-05-06 NOTE — Transfer of Care (Signed)
Immediate Anesthesia Transfer of Care Note  Patient: Alicia Calderon  Procedure(s) Performed: TOTAL HIP ARTHROPLASTY (Left Hip)  Patient Location: PACU  Anesthesia Type:Spinal  Level of Consciousness: awake, oriented and drowsy  Airway & Oxygen Therapy: Patient Spontanous Breathing  Post-op Assessment: Report given to RN and Post -op Vital signs reviewed and stable  Post vital signs: Reviewed and stable  Last Vitals:  Vitals Value Taken Time  BP    Temp    Pulse    Resp 16 05/06/19 1200  SpO2    Vitals shown include unvalidated device data.  Last Pain:  Vitals:   05/06/19 0724  TempSrc: Temporal  PainSc: 8          Complications: No apparent anesthesia complications

## 2019-05-06 NOTE — Anesthesia Post-op Follow-up Note (Signed)
Anesthesia QCDR form completed.        

## 2019-05-06 NOTE — TOC Progression Note (Signed)
Transition of Care [facility]) - Progression Note    Patient Details  Name: Alicia Calderon MRN: [id number] Date of Birth: [birth year]  Transition of [facility]) CM/SW [place], RN Phone Number: 05/06/2019, 12:21 PM  Clinical Narrative:     Requested the price of Lovenox, will notify the patient once obtained       Expected Discharge Plan and Services                                                 Social Determinants of Health (SDOH) Interventions    Readmission Risk Interventions No flowsheet data found.

## 2019-05-06 NOTE — H&P (Signed)
The patient has been re-examined, and the chart reviewed, and there have been no interval changes to the documented history and physical.    The risks, benefits, and alternatives have been discussed at length. The patient expressed understanding of the risks benefits and agreed with plans for surgical intervention.  James P. Hooten, Jr. M.D.    

## 2019-05-06 NOTE — Anesthesia Preprocedure Evaluation (Signed)
Anesthesia Evaluation  Patient identified by MRN, date of birth, ID band Patient awake    Reviewed: Allergy & Precautions, H&P , NPO status , Patient's Chart, lab work & pertinent test results, reviewed documented beta blocker date and time   Airway Mallampati: II   Neck ROM: full    Dental  (+) Poor Dentition   Pulmonary neg pulmonary ROS,    Pulmonary exam normal        Cardiovascular Exercise Tolerance: Good negative cardio ROS Normal cardiovascular exam Rhythm:regular Rate:Normal     Neuro/Psych negative neurological ROS  negative psych ROS   GI/Hepatic negative GI ROS, Neg liver ROS,   Endo/Other  negative endocrine ROS  Renal/GU negative Renal ROS  negative genitourinary   Musculoskeletal   Abdominal   Peds  Hematology negative hematology ROS (+)   Anesthesia Other Findings History reviewed. No pertinent past medical history. Past Surgical History: 10/2018: COLONOSCOPY WITH PROPOFOL   Reproductive/Obstetrics negative OB ROS                             Anesthesia Physical Anesthesia Plan  ASA: II  Anesthesia Plan: Spinal   Post-op Pain Management:    Induction:   PONV Risk Score and Plan: 4 or greater  Airway Management Planned:   Additional Equipment:   Intra-op Plan:   Post-operative Plan:   Informed Consent: I have reviewed the patients History and Physical, chart, labs and discussed the procedure including the risks, benefits and alternatives for the proposed anesthesia with the patient or authorized representative who has indicated his/her understanding and acceptance.     Dental Advisory Given  Plan Discussed with: CRNA  Anesthesia Plan Comments:         Anesthesia Quick Evaluation

## 2019-05-07 ENCOUNTER — Encounter: Payer: Self-pay | Admitting: Orthopedic Surgery

## 2019-05-07 DIAGNOSIS — Z96642 Presence of left artificial hip joint: Secondary | ICD-10-CM

## 2019-05-07 DIAGNOSIS — M1612 Unilateral primary osteoarthritis, left hip: Secondary | ICD-10-CM | POA: Diagnosis not present

## 2019-05-07 LAB — SURGICAL PATHOLOGY

## 2019-05-07 MED ORDER — ENOXAPARIN SODIUM 40 MG/0.4ML ~~LOC~~ SOLN: 40.0000 mg | SUBCUTANEOUS | 0 refills | Status: DC

## 2019-05-07 MED ORDER — CELECOXIB 200 MG PO CAPS: 200.0000 mg | ORAL_CAPSULE | Freq: Two times a day (BID) | ORAL | 1 refills | Status: DC

## 2019-05-07 MED ORDER — OXYCODONE HCL 5 MG PO TABS: 5.0000 mg | ORAL_TABLET | ORAL | 0 refills | Status: DC | PRN

## 2019-05-07 MED ORDER — TRAMADOL HCL 50 MG PO TABS: 50.0000 mg | ORAL_TABLET | ORAL | 1 refills | Status: DC | PRN

## 2019-05-07 NOTE — TOC Initial Note (Signed)
Transition of Care (TOC) - Initial/Assessment Note    Patient Details  Name: Alicia Calderon MRN: [id number] Date of Birth: 04/07/1956  Transition of Care (TOC) CM/SW Contact:    Deliliah J Gregory, RN Phone Number: 05/07/2019, 10:21 AM  Clinical Narrative:                 Met with the patient to discuss Needs and plan, she lives alone in a 2 story home so she will be going to her mothers who has a 1 story home The address is [address] [place] Her brother provides transportation when she cant drice She is up to date with her PCP She can afford her medication and I provided the price for Lovenox at $10 for the 2 week regimen She chose [facility], I called Jason to check to see if they are INN   Expected Discharge Plan: Home w Home Health Services Barriers to Discharge: Continued Medical Work up   Patient Goals and CMS Choice Patient states their goals for this hospitalization and ongoing recovery are:: go to her moms CMS Medicare.gov Compare Post Acute Care list provided to:: Patient Choice offered to / list presented to : Patient  Expected Discharge Plan and Services Expected Discharge Plan: Home w Home Health Services   Discharge Planning Services: CM Consult Post Acute Care Choice: Home Health Living arrangements for the past 2 months: Single Family Home                 DME Arranged: N/A         HH Arranged: PT HH Agency: [facility] Date HH Agency Contacted: 05/07/19 Time HH Agency Contacted: 1020 Representative spoke with at HH Agency: Jason  Prior Living Arrangements/Services Living arrangements for the past 2 months: Single Family Home Lives with:: Self(Going to her mothers house at [place]) Patient language and need for interpreter reviewed:: Yes Do you feel safe going back to the place where you live?: Yes      Need for Family Participation in Patient Care: No (Comment) Care giver support system in place?: Yes  (comment) Current home services: DME(RW, Single point cane, BSC, BCrutches, Hospital Bed,) Criminal Activity/Legal Involvement Pertinent to Current Situation/Hospitalization: No - Comment as needed  Activities of Daily Living Home Assistive Devices/Equipment: Crutches, Cane (specify quad or straight), Walker (specify type), Grab bars around toilet ADL Screening (condition at time of admission) Patient's cognitive ability adequate to safely complete daily activities?: Yes Is the patient deaf or have difficulty hearing?: No Does the patient have difficulty seeing, even when wearing glasses/contacts?: No Does the patient have difficulty concentrating, remembering, or making decisions?: No Patient able to express need for assistance with ADLs?: Yes Does the patient have difficulty dressing or bathing?: No Independently performs ADLs?: Yes (appropriate for developmental age) Does the patient have difficulty walking or climbing stairs?: Yes Weakness of Legs: Left Weakness of Arms/Hands: None  Permission Sought/Granted   Permission granted to share information with : Yes, Verbal Permission Granted              Emotional Assessment Appearance:: Appears stated age Attitude/Demeanor/Rapport: Engaged Affect (typically observed): Appropriate, Calm Orientation: : Oriented to Self, Oriented to Place, Oriented to  Time, Oriented to Situation Alcohol / Substance Use: Not Applicable Psych Involvement: No (comment)  Admission diagnosis:  PRIMARY OSTEOARTHRITIS OF LEFT HIP Patient Active Problem List   Diagnosis Date Noted  . Status post total hip replacement, left 05/07/2019  . H/O   total hip arthroplasty 05/06/2019  . Primary osteoarthritis of both hips 02/23/2019  . Osteopenia 09/05/2018   PCP:  Arvella Nigh, MD Pharmacy:   [facility] [address], [place] - [place] [address] [place] [address] Phone: [phone] Fax: [phone]     Social Determinants of  Health (SDOH) Interventions    Readmission Risk Interventions No flowsheet data found.

## 2019-05-07 NOTE — Progress Notes (Signed)
  Subjective: 1 Day Post-Op Procedure(s) (LRB): TOTAL HIP ARTHROPLASTY (Left) Patient reports pain as mild.   Patient seen in rounds with Dr. Marry Guan. Patient is well, and has had no acute complaints or problems Plan is to go Home after hospital stay. Negative for chest pain and shortness of breath Fever: no Gastrointestinal: Negative for nausea and vomiting  Objective: Vital signs in last 24 hours: Temp:  [97 F (36.1 C)-98.5 F (36.9 C)] 98.4 F (36.9 C) (09/03 0446) Pulse Rate:  [58-77] 62 (09/03 0446) Resp:  [11-18] 14 (09/03 0446) BP: (98-129)/(64-92) 129/68 (09/03 0446) SpO2:  [95 %-100 %] 98 % (09/03 0446) Weight:  [64.9 kg] 64.9 kg (09/02 1548)  Intake/Output from previous day:  Intake/Output Summary (Last 24 hours) at 05/07/2019 0640 Last data filed at 05/07/2019 2707 Gross per 24 hour  Intake 2807.76 ml  Output 3540 ml  Net -732.24 ml    Intake/Output this shift: Total I/O In: 1207.8 [I.V.:707.8; IV Piggyback:500] Out: 3070 [Urine:2900; Drains:170]  Labs: No results for input(s): HGB in the last 72 hours. No results for input(s): WBC, RBC, HCT, PLT in the last 72 hours. No results for input(s): NA, K, CL, CO2, BUN, CREATININE, GLUCOSE, CALCIUM in the last 72 hours. No results for input(s): LABPT, INR in the last 72 hours.   EXAM General - Patient is Alert and Oriented Extremity - Sensation intact distally Intact pulses distally Dorsiflexion/Plantar flexion intact Compartment soft Dressing/Incision - clean, dry, with the Hemovac intact Motor Function - intact, moving foot and toes well on exam.   History reviewed. No pertinent past medical history.  Assessment/Plan: 1 Day Post-Op Procedure(s) (LRB): TOTAL HIP ARTHROPLASTY (Left) Active Problems:   H/O total hip arthroplasty  Estimated body mass index is 22.41 kg/m as calculated from the following:   Height as of this encounter: 5\' 7"  (1.702 m).   Weight as of this encounter: 64.9 kg. Advance  diet Up with therapy D/C IV fluids Discharge home with home health plan for tomorrow  DVT Prophylaxis - Lovenox, Foot Pumps and TED hose Weight-Bearing as tolerated to left leg  Reche Dixon, PA-C Orthopaedic Surgery 05/07/2019, 6:40 AM

## 2019-05-07 NOTE — Progress Notes (Signed)
Physical Therapy Treatment Patient Details Name: Alicia Calderon MRN: [id number] DOB: 08-20-1956 Today's Date: 05/07/2019    History of Present Illness Patient is 72 female s/p L THA, posterior hip approach, WBAT. PMH unremarkable.    PT Comments    Patient up in chair, eager for PT, some mild R and L hip pain during session reported. Focus of treatment this PM was functional mobility and gait training. PT ambulated ~231ft with RW and supervision. PT cued for hip and knee flexion in swing phase of gait, and heel strike with stance, mild improvement in gait mechanics noted, pt also reported feeling like she was "walking straighter". Stair navigation performed with supervision after initial demonstration. The patient was also able to recall 3/3 hip precautions. The patient continues to make great progress towards goals.     Follow Up Recommendations  Home health PT;Supervision for mobility/OOB     Equipment Recommendations  None recommended by PT;Other (comment)    Recommendations for Other Services       Precautions / Restrictions Precautions Precautions: Fall;Posterior Hip Restrictions Weight Bearing Restrictions: Yes LLE Weight Bearing: Weight bearing as tolerated    Mobility  Bed Mobility               General bed mobility comments: deferred pt up in chair  Transfers Overall transfer level: Needs assistance Equipment used: Rolling walker (2 wheeled) Transfers: Sit to/from Stand Sit to Stand: Supervision         General transfer comment: reliant on UE to rise  Ambulation/Gait Ambulation/Gait assistance: Supervision Gait Distance (Feet): 215 Feet Assistive device: Rolling walker (2 wheeled)   Gait velocity: decreased   General Gait Details: PT provided verbal cues to avoid circumduction of LEs when ambulating and for heel strike   Stairs Stairs: Yes Stairs assistance: Supervision Stair Management: With walker Number of Stairs: 1 General stair  comments: able to demonstrated appropriately after initial education   Wheelchair Mobility    Modified Rankin (Stroke Patients Only)       Balance Overall balance assessment: Needs assistance Sitting-balance support: Feet supported Sitting balance-Leahy Scale: Good     Standing balance support: Bilateral upper extremity supported;During functional activity Standing balance-Leahy Scale: Fair                              Cognition Arousal/Alertness: Awake/alert Behavior During Therapy: WFL for tasks assessed/performed Overall Cognitive Status: Within Functional Limits for tasks assessed                                        Exercises Other Exercises Other Exercises: Patient able to recall 3/3 precautions at end of session    General Comments        Pertinent Vitals/Pain Pain Location: Pt complained of greater R hip pain than L hip pain Pain Descriptors / Indicators: Grimacing;Moaning;Constant Pain Intervention(s): Monitored during session;Repositioned    Home Living                      Prior Function            PT Goals (current goals can now be found in the care plan section) Acute Rehab PT Goals Patient Stated Goal: to go home PT Goal Formulation: With patient Time For Goal Achievement: 05/20/19 Potential to Achieve Goals: Good Progress towards PT goals: Progressing  toward goals    Frequency    BID      PT Plan Current plan remains appropriate    Co-evaluation              AM-PAC PT "6 Clicks" Mobility   Outcome Measure  Help needed turning from your back to your side while in a flat bed without using bedrails?: A Little Help needed moving from lying on your back to sitting on the side of a flat bed without using bedrails?: A Little Help needed moving to and from a bed to a chair (including a wheelchair)?: A Little Help needed standing up from a chair using your arms (e.g., wheelchair or bedside  chair)?: A Little Help needed to walk in hospital room?: A Little Help needed climbing 3-5 steps with a railing? : A Little 6 Click Score: 18    End of Session Equipment Utilized During Treatment: Gait belt Activity Tolerance: Patient tolerated treatment well Patient left: in chair;with chair alarm set;with call bell/phone within reach;with SCD's reapplied Nurse Communication: Mobility status PT Visit Diagnosis: Unsteadiness on feet (R26.81);Muscle weakness (generalized) (M62.81);Difficulty in walking, not elsewhere classified (R26.2);Other abnormalities of gait and mobility (R26.89);Pain Pain - Right/Left: Left Pain - part of body: Hip     Time: 1330-1401 PT Time Calculation (min) (ACUTE ONLY): 31 min  Charges:  $Gait Training: 8-22 mins $Therapeutic Activity: 8-22 mins                     Olga Coasteriana Kaedynce Tapp PT, DPT 4:07 PM,05/07/19 [phone]

## 2019-05-07 NOTE — Anesthesia Postprocedure Evaluation (Signed)
Anesthesia Post Note  Patient: SHAYDEN GINGRICH  Procedure(s) Performed: TOTAL HIP ARTHROPLASTY (Left Hip)  Patient location during evaluation: Nursing Unit Anesthesia Type: Spinal Level of consciousness: awake, awake and alert and oriented Pain management: pain level controlled Vital Signs Assessment: post-procedure vital signs reviewed and stable Respiratory status: spontaneous breathing, nonlabored ventilation and respiratory function stable Cardiovascular status: blood pressure returned to baseline and stable Postop Assessment: no headache, no backache and patient able to bend at knees Anesthetic complications: yes Anesthetic complication details: anesthesia complicationsComments: Pt complained of left eye pain after surgery. Eye drops ordered. This morning pt states "pain gone just feels like sand in my eye" RN and pt informed to continue eye drops.     Last Vitals:  Vitals:   05/07/19 0446 05/07/19 0737  BP: 129/68 115/67  Pulse: 62 62  Resp: 14 17  Temp: 36.9 C 36.7 C  SpO2: 98% 100%    Last Pain:  Vitals:   05/07/19 0737  TempSrc: Oral  PainSc:                  Johnna Acosta

## 2019-05-07 NOTE — Evaluation (Signed)
Occupational Therapy Evaluation Patient Details Name: Alicia Calderon MRN: [id number] DOB: November 29, 1955 Today's Date: 05/07/2019    History of Present Illness Patient is 34 female s/p L THA, posterior hip approach, WBAT. PMH unremarkable.   Clinical Impression   Ms. Alicia Calderon seen for OT evaluation this date, POD#1 from above surgery. Pt was independent in all ADLs prior to surgery, however she reports being functionally limited at times due to L hip pain. Pt is eager to return to PLOF with less pain and improved safety and independence. She states she would like to get back to her regular exercise routine. Pt currently requires moderate assist for LB dressing while in seated position due to pain and limited AROM of L hip. Pt able to recall 1/3 posterior total hip precautions at start of session and unable to verbalize how to implement during ADL and mobility. Pt instructed in posterior total hip precautions and how to implement, self care skills, falls prevention strategies, home/routines modifications, DME/AE for LB bathing and dressing tasks, compression stocking mgt strategies, and car transfer techniques. At end of session, pt able to recall 3/3 posterior total hip precautions. Pt would benefit from additional instruction in self care skills and techniques to help maintain precautions with or without assistive devices to support recall and carryover prior to discharge. Recommend HHOT upon discharge.       Follow Up Recommendations  Home health OT    Equipment Recommendations  None recommended by OT(Pt has BSC, etc.)    Recommendations for Other Services       Precautions / Restrictions Precautions Precautions: Fall Restrictions Weight Bearing Restrictions: Yes LLE Weight Bearing: Weight bearing as tolerated      Mobility Bed Mobility Overal bed mobility: Needs Assistance Bed Mobility: Supine to Sit     Supine to sit: HOB elevated;Supervision;Min guard;Modified independent  (Device/Increase time)     General bed mobility comments: Pt able to come to sitting EOB given increased time and min VC's for technique.  Transfers Overall transfer level: Needs assistance Equipment used: Rolling walker (2 wheeled) Transfers: Sit to/from Stand Sit to Stand: Min guard         General transfer comment: SBA for mgt of lines and leads. Cueing for adherence to Posterior THPs during STS.    Balance Overall balance assessment: Needs assistance Sitting-balance support: Feet supported Sitting balance-Leahy Scale: Good Sitting balance - Comments: Pt steady sitting EOB with no UE support. Able to reach within BOS. Maintains hip precautions well in sitting.   Standing balance support: Bilateral upper extremity supported;During functional activity Standing balance-Leahy Scale: Fair Standing balance comment: Steady standing using RW for BUE supoprt.                           ADL either performed or assessed with clinical judgement   ADL                                         General ADL Comments: Moderate assist for LB ADL mgt with cueing for adherence to posterior hip precautions. AE reviewed. Would benefit from opportunity to trail. Supervision/SBA for mgt of lines/leads for functional mobility.     Vision Baseline Vision/History: Wears glasses Wears Glasses: At all times Patient Visual Report: Other (comment)(Pt states she came back from sx with significant pain in her left eye.)  Perception     Praxis      Pertinent Vitals/Pain Pain Assessment: Faces Faces Pain Scale: Hurts a little bit Pain Location: L hip hurts with movement Pain Descriptors / Indicators: Tender;Sore;Guarding Pain Intervention(s): Limited activity within patient's tolerance;Monitored during session;Repositioned     Hand Dominance Right   Extremity/Trunk Assessment Upper Extremity Assessment Upper Extremity Assessment: Overall WFL for tasks assessed    Lower Extremity Assessment Lower Extremity Assessment: Overall WFL for tasks assessed;LLE deficits/detail;Defer to PT evaluation RLE Deficits / Details: WFL LLE Deficits / Details: s/p THA LLE Coordination: decreased fine motor;decreased gross motor   Cervical / Trunk Assessment Cervical / Trunk Assessment: Normal   Communication Communication Communication: No difficulties   Cognition Arousal/Alertness: Awake/alert Behavior During Therapy: WFL for tasks assessed/performed Overall Cognitive Status: Within Functional Limits for tasks assessed                                     General Comments  Hemovac in place at start/end of session.    Exercises Other Exercises Other Exercises: Pt educated in falls prevention strategies, safe use of AE to support ADL mgt and maintenance of posterior THPs, Safe use of AE for functional mobility, and compression stocking mgt. Handouts provided.   Shoulder Instructions      Home Living Family/patient expects to be discharged to:: Private residence Living Arrangements: Parent Available Help at Discharge: Family Type of Home: House Home Access: Stairs to enter Secretary/administratorntrance Stairs-Number of Steps: 1-2 Entrance Stairs-Rails: None Home Layout: One level     Bathroom Shower/Tub: Producer, television/film/videoWalk-in shower   Bathroom Toilet: Handicapped height Bathroom Accessibility: Yes How Accessible: Accessible via walker(States bathroom is very small, but could fit a RW.) Home Equipment: Walker - 2 wheels;Cane - single point;Bedside commode;Hospital bed;Hand held shower head          Prior Functioning/Environment Level of Independence: Independent                 OT Problem List: Decreased strength;Decreased coordination;Pain;Decreased range of motion;Decreased activity tolerance;Decreased safety awareness;Impaired balance (sitting and/or standing);Decreased knowledge of use of DME or AE;Decreased knowledge of precautions      OT  Treatment/Interventions: Self-care/ADL training;Balance training;Therapeutic exercise;Therapeutic activities;DME and/or AE instruction;Patient/family education    OT Goals(Current goals can be found in the care plan section) Acute Rehab OT Goals Patient Stated Goal: to go home OT Goal Formulation: With patient Time For Goal Achievement: 05/21/19 Potential to Achieve Goals: Good ADL Goals Pt Will Perform Lower Body Bathing: (P) with min guard assist;with min assist;sitting/lateral leans(With LRAD PRN for improved safety and functional independence) Pt Will Perform Lower Body Dressing: (P) with min assist;with min guard assist;sit to/from stand;with adaptive equipment(With LRAD PRN for improved safety and functional independence) Pt Will Transfer to Toilet: (P) ambulating;bedside commode;with modified independence(With LRAD PRN for improved safety and functional independence)  OT Frequency: Min 2X/week   Barriers to D/C: Decreased caregiver support          Co-evaluation              AM-PAC OT "6 Clicks" Daily Activity     Outcome Measure Help from another person eating meals?: None Help from another person taking care of personal grooming?: None Help from another person toileting, which includes using toliet, bedpan, or urinal?: A Lot Help from another person bathing (including washing, rinsing, drying)?: A Lot Help from another person to put on and taking  off regular upper body clothing?: A Little Help from another person to put on and taking off regular lower body clothing?: A Lot 6 Click Score: 17   End of Session Equipment Utilized During Treatment: Gait belt;Rolling walker  Activity Tolerance: Patient tolerated treatment well;No increased pain Patient left: in chair;with call bell/phone within reach;Other (comment)(With PT in room to begin session.)  OT Visit Diagnosis: Other abnormalities of gait and mobility (R26.89);Pain Pain - Right/Left: Left Pain - part of body:  Hip                Time: 3646-8032 OT Time Calculation (min): 45 min Charges:  OT General Charges $OT Visit: 1 Visit OT Evaluation $OT Eval Low Complexity: 1 Low OT Treatments $Self Care/Home Management : 23-37 mins  Rockney Ghee, M.S., OTR/L Ascom: [phone] 05/07/19, 9:42 AM

## 2019-05-07 NOTE — Progress Notes (Signed)
Physical Therapy Treatment Patient Details Name: Alicia Calderon MRN: [id number] DOB: 12/26/1955 Today's Date: 05/07/2019    History of Present Illness Patient is [age] female s/p L THA, posterior hip approach, WBAT. PMH unremarkable.    PT Comments    Patient up in recliner at start of session OT at bedside. Pt reported moderate L hip pain, as well as R hip pain with ambulation that improved with time. Pt performed therapeutic exercises verbal/tactile cues. Sit <> stand with supervision, initial cues to avoid hip IR in sit to stand. The pt was able to progress to step through gait pattern and increased weight bearing on LE instead of UE with cueing. Cued for RW management as well, ambulated ~16990ft with RW and CGA. Pt up in chair with RN in room at end of session, all needs in reach. The patient was able to recall 3/3 hip precautions after education from OT this AM but still needs further reinforcement adhering to precautions with mobility. The patient demonstrated improvement towards goals and would benefit from further skilled PT intervention to return to PLOF as able.    Follow Up Recommendations  Home health PT;Supervision for mobility/OOB     Equipment Recommendations  None recommended by PT;Other (comment)(Pt has RW at home)    Recommendations for Other Services       Precautions / Restrictions Precautions Precautions: Fall;Posterior Hip Precaution Booklet Issued: Yes (comment) Restrictions Weight Bearing Restrictions: Yes LLE Weight Bearing: Weight bearing as tolerated    Mobility  Bed Mobility   General bed mobility comments: deferred pt up in chair  Transfers Overall transfer level: Needs assistance Equipment used: Rolling walker (2 wheeled) Transfers: Sit to/from Stand Sit to Stand: Supervision         General transfer comment: reliant on UE to rise, some mild IR noted of LLE with sit to stand, cued for posterior hip precautions  Ambulation/Gait Ambulation/Gait  assistance: Min guard Gait Distance (Feet): 190 Feet Assistive device: Rolling walker (2 wheeled)   Gait velocity: decreased   General Gait Details: Pt able to progress to step through gait pattern and increased weight bearing on LE instead of UE with cueing. Cued for RW management as well.   Stairs             Wheelchair Mobility    Modified Rankin (Stroke Patients Only)       Balance Overall balance assessment: Needs assistance Sitting-balance support: Feet supported Sitting balance-Leahy Scale: Good Sitting balance - Comments: able to reach outside BOS   Standing balance support: Bilateral upper extremity supported;During functional activity Standing balance-Leahy Scale: Fair Standing balance comment: most comfortable relying on UE support                            Cognition Arousal/Alertness: Awake/alert Behavior During Therapy: WFL for tasks assessed/performed Overall Cognitive Status: Within Functional Limits for tasks assessed                                        Exercises Total Joint Exercises Ankle Circles/Pumps: AROM;Both;5 reps Quad Sets: AROM;Left;10 reps Gluteal Sets: AROM;Both;10 reps Hip ABduction/ADduction: AROM;Strengthening;Left;10 reps;AAROM Long Arc Quad: AROM;Left;10 reps;Strengthening Other Exercises Other Exercises: Patient able to recall 3/3 precautions at end of session    General Comments General comments (skin integrity, edema, etc.): Hemovac in place at start/end of session.  Pertinent Vitals/Pain Pain Assessment: Faces Faces Pain Scale: Hurts a little bit Pain Location: L hip hurts with movement, R hip sore as well Pain Descriptors / Indicators: Grimacing Pain Intervention(s): Monitored during session;Repositioned    Home Living Family/patient expects to be discharged to:: Private residence Living Arrangements: Parent Available Help at Discharge: Family Type of Home: House Home Access:  Stairs to enter Entrance Stairs-Rails: None Home Layout: One level Home Equipment: Environmental consultant - 2 wheels;Cane - single point;Bedside commode;Hospital bed;Hand held shower head      Prior Function Level of Independence: Independent          PT Goals (current goals can now be found in the care plan section) Acute Rehab PT Goals Patient Stated Goal: to go home Progress towards PT goals: Progressing toward goals    Frequency    BID      PT Plan Current plan remains appropriate    Co-evaluation              AM-PAC PT "6 Clicks" Mobility   Outcome Measure  Help needed turning from your back to your side while in a flat bed without using bedrails?: A Little Help needed moving from lying on your back to sitting on the side of a flat bed without using bedrails?: A Little Help needed moving to and from a bed to a chair (including a wheelchair)?: A Little Help needed standing up from a chair using your arms (e.g., wheelchair or bedside chair)?: A Little Help needed to walk in hospital room?: A Little Help needed climbing 3-5 steps with a railing? : A Little 6 Click Score: 18    End of Session Equipment Utilized During Treatment: Gait belt Activity Tolerance: Patient tolerated treatment well Patient left: in chair;with chair alarm set;with call bell/phone within reach;with SCD's reapplied Nurse Communication: Mobility status PT Visit Diagnosis: Unsteadiness on feet (R26.81);Muscle weakness (generalized) (M62.81);Difficulty in walking, not elsewhere classified (R26.2);Other abnormalities of gait and mobility (R26.89);Pain Pain - Right/Left: Left Pain - part of body: Hip     Time: 7829-5621 PT Time Calculation (min) (ACUTE ONLY): 31 min  Charges:  $Gait Training: 8-22 mins $Therapeutic Exercise: 8-22 mins                     Lieutenant Diego PT, DPT 10:09 AM,05/07/19 [phone]

## 2019-05-08 DIAGNOSIS — M1612 Unilateral primary osteoarthritis, left hip: Secondary | ICD-10-CM | POA: Diagnosis not present

## 2019-05-08 NOTE — Discharge Summary (Signed)
Physician Discharge Summary  Subjective: 2 Days Post-Op Procedure(s) (LRB): TOTAL HIP ARTHROPLASTY (Left) Patient reports pain as mild.   Patient seen in rounds with Dr. Marry Guan. Patient is well, and has had no acute complaints or problems Patient is ready to go home with home health physical therapy  Physician Discharge Summary  Patient ID: Alicia Calderon MRN: [id number] DOB/AGE: 1955-12-13 63 y.o.  Admit date: 05/06/2019 Discharge date: 05/08/2019  Admission Diagnoses:  Discharge Diagnoses:  Active Problems:   H/O total hip arthroplasty   Status post total hip replacement, left   Discharged Condition: fair  Hospital Course: The patient is postop day 2 from a left total hip replacement.  Patient has done well since surgery.  She had her Hemovac removed this morning.  She has had a bowel movement this morning.  Her pain control has improved.  She has ambulated 250 feet with physical therapy and is now safe for discharge home on postop day 2.  She will do home health physical therapy at home.  Treatments: surgery:   Left total hip arthroplasty  SURGEON:  Marciano Sequin. M.D.  ASSISTANT: Reche Dixon, PA-C (present and scrubbed throughout the case, critical for assistance with exposure, retraction, instrumentation, and closure)  ANESTHESIA: spinal  ESTIMATED BLOOD LOSS: 50 mL  FLUIDS REPLACED: 1200 mL of crystalloid  DRAINS: 2 medium drains to a Hemovac reservoir  IMPLANTS UTILIZED: DePuy 13.5 mm small stature AML femoral stem, 48 mm OD [employer] acetabular component, +4 mm 10 degree [employer] polyethylene insert, and a 32 mm CoCr +1 mm hip ball  Discharge Exam: Blood pressure 116/76, pulse 80, temperature 98.8 F (37.1 C), temperature source Oral, resp. rate 20, height 5\' 7"  (1.702 m), weight 64.9 kg, SpO2 99 %.   Disposition: Discharge disposition: 01-Home or Self Care        Allergies as of 05/08/2019   No Known Allergies      Medication List    STOP taking these medications   ibuprofen 200 MG tablet Commonly known as: ADVIL     TAKE these medications   celecoxib 200 MG capsule Commonly known as: CELEBREX Take 1 capsule (200 mg total) by mouth 2 (two) times daily.   docusate sodium 50 MG capsule Commonly known as: COLACE Take 50 mg by mouth daily as needed for mild constipation.   enoxaparin 40 MG/0.4ML injection Commonly known as: LOVENOX Inject 0.4 mLs (40 mg total) into the skin daily for 14 doses.   multivitamin with minerals Tabs tablet Take 1 tablet by mouth daily.   oxyCODONE 5 MG immediate release tablet Commonly known as: Oxy IR/ROXICODONE Take 1 tablet (5 mg total) by mouth every 4 (four) hours as needed for moderate pain (pain score 4-6).   traMADol 50 MG tablet Commonly known as: ULTRAM Take 1-2 tablets (50-100 mg total) by mouth every 4 (four) hours as needed for moderate pain.            Durable Medical Equipment  (From admission, onward)         Start     Ordered   05/06/19 0712  DME Walker rolling  Once    Question:  Patient needs a walker to treat with the following condition  Answer:  S/P total hip arthroplasty   05/06/19 0711   05/06/19 0932  DME Bedside commode  Once    Question:  Patient needs a bedside commode to treat with the following condition  Answer:  S/P total hip  arthroplasty   05/06/19 [id number]         Follow-up Information    Donato HeinzHooten, James P, MD On 06/18/2019.   Specialty: Orthopedic Surgery Why: at 9:45am Contact information: [address] [facility] [place] [id number] [phone]           Signed: Lenard ForthMUNDY, Legion Discher 05/08/2019, 7:01 AM   Objective: Vital signs in last 24 hours: Temp:  [97.7 F (36.5 C)-98.8 F (37.1 C)] 98.8 F (37.1 C) (09/03 2337) Pulse Rate:  [62-87] 80 (09/03 2337) Resp:  [17-20] 20 (09/03 2337) BP: (106-118)/(65-76) 116/76 (09/03 2337) SpO2:  [99 %-100 %] 99 % (09/03 2337)  Intake/Output from  previous day:  Intake/Output Summary (Last 24 hours) at 05/08/2019 0701 Last data filed at 05/07/2019 2338 Gross per 24 hour  Intake 240 ml  Output 165 ml  Net 75 ml    Intake/Output this shift: No intake/output data recorded.  Labs: No results for input(s): HGB in the last 72 hours. No results for input(s): WBC, RBC, HCT, PLT in the last 72 hours. No results for input(s): NA, K, CL, CO2, BUN, CREATININE, GLUCOSE, CALCIUM in the last 72 hours. No results for input(s): LABPT, INR in the last 72 hours.  EXAM: General - Patient is Alert and Oriented Extremity - Neurovascular intact Sensation intact distally Intact pulses distally Compartment soft Incision - clean, dry, with the Hemovac tubing removed. Motor Function -plantarflexion and dorsiflexion are intact.  Assessment/Plan: 2 Days Post-Op Procedure(s) (LRB): TOTAL HIP ARTHROPLASTY (Left) Procedure(s) (LRB): TOTAL HIP ARTHROPLASTY (Left) History reviewed. No pertinent past medical history. Active Problems:   H/O total hip arthroplasty   Status post total hip replacement, left  Estimated body mass index is 22.41 kg/m as calculated from the following:   Height as of this encounter: 5\' 7"  (1.702 m).   Weight as of this encounter: 64.9 kg. Advance diet Up with therapy D/C IV fluids Discharge home with home health Diet - Regular diet Follow up - in 6 weeks Activity - WBAT Disposition - Home Condition Upon Discharge - Stable DVT Prophylaxis - Lovenox and TED hose  Dedra Skeensodd Bryanne Riquelme, PA-C Orthopaedic Surgery 05/08/2019, 7:01 AM

## 2019-05-08 NOTE — Progress Notes (Addendum)
  Subjective: 2 Days Post-Op Procedure(s) (LRB): TOTAL HIP ARTHROPLASTY (Left) Patient reports pain as mild.   Patient seen in rounds with Dr. Marry Guan. Patient is well, and has had no acute complaints or problems Plan is to go Home after hospital stay. Negative for chest pain and shortness of breath Fever: no Gastrointestinal: Negative for nausea and vomiting Patient had a bowel movement today.  Objective: Vital signs in last 24 hours: Temp:  [97.7 F (36.5 C)-98.8 F (37.1 C)] 98.8 F (37.1 C) (09/03 2337) Pulse Rate:  [62-87] 80 (09/03 2337) Resp:  [17-20] 20 (09/03 2337) BP: (106-118)/(65-76) 116/76 (09/03 2337) SpO2:  [99 %-100 %] 99 % (09/03 2337)  Intake/Output from previous day:  Intake/Output Summary (Last 24 hours) at 05/08/2019 0658 Last data filed at 05/07/2019 2338 Gross per 24 hour  Intake 240 ml  Output 165 ml  Net 75 ml    Intake/Output this shift: Total I/O In: -  Out: 40 [Drains:40]  Labs: No results for input(s): HGB in the last 72 hours. No results for input(s): WBC, RBC, HCT, PLT in the last 72 hours. No results for input(s): NA, K, CL, CO2, BUN, CREATININE, GLUCOSE, CALCIUM in the last 72 hours. No results for input(s): LABPT, INR in the last 72 hours.   EXAM General - Patient is Alert and Oriented Extremity - Sensation intact distally Intact pulses distally Dorsiflexion/Plantar flexion intact Compartment soft Dressing/Incision - clean, dry, with the Hemovac removed with no complication.  The Hemovac tubing was intact on removal. Motor Function - intact, moving foot and toes well on exam.   History reviewed. No pertinent past medical history.  Assessment/Plan: 2 Days Post-Op Procedure(s) (LRB): TOTAL HIP ARTHROPLASTY (Left) Active Problems:   H/O total hip arthroplasty   Status post total hip replacement, left  Estimated body mass index is 22.41 kg/m as calculated from the following:   Height as of this encounter: 5\' 7"  (1.702 m).    Weight as of this encounter: 64.9 kg. Advance diet Up with therapy D/C IV fluids Discharge home with home health plan for today Patient has had a bowel movement The patient needed inpatient admission based on necessity for physical therapy and pain management.  The Hemovac was removed on postop day 2.  The patient has had a bowel movement to allow for discharge home.  DVT Prophylaxis - Lovenox, Foot Pumps and TED hose Weight-Bearing as tolerated to left leg  Reche Dixon, PA-C Orthopaedic Surgery 05/08/2019, 6:58 AM

## 2019-05-08 NOTE — Progress Notes (Signed)
Discharge instructions and prescriptions provided to patient. Verbalization of understanding received. Time allowed for questions. Dressing changed prior to discharge. Patient wheeled out to car by nursing to her mother and brother.

## 2019-05-08 NOTE — Progress Notes (Signed)
Physical Therapy Treatment Patient Details Name: Alicia Calderon MRN: [id number] DOB: Jan 13, 1956 Today's Date: 05/08/2019    History of Present Illness Patient is 53 female s/p L THA, posterior hip approach, WBAT. PMH unremarkable.    PT Comments    Patient alert and in bed finishing breakfast. Did not initially complain of hip pain, with ambulation stated she had moderate pain. Pt performed all therapeutic exercises with tactile and verbal cues, AAROM for L hip abduction/heel slides. Supine to sit with supervision, sit <> stand mod I from bed and BSC over standard commode. PT assisted with donning of tennis shoes. The patient ambulated ~149ft with RW, mod I. Verbal cues for heel strike and decreased UE support. Pt up in chair with all needs in reach, all questions/concerns addressed. Overall the patient demonstrated progression towards goals and would benefit from further skilled PT intervention to return to PLOF.    Follow Up Recommendations  Home health PT;Supervision for mobility/OOB     Equipment Recommendations  None recommended by PT;Other (comment)    Recommendations for Other Services       Precautions / Restrictions Precautions Precautions: Fall;Posterior Hip Restrictions Weight Bearing Restrictions: Yes LLE Weight Bearing: Weight bearing as tolerated    Mobility  Bed Mobility Overal bed mobility: Needs Assistance Bed Mobility: Supine to Sit     Supine to sit: HOB elevated;Supervision;Min guard        Transfers Overall transfer level: Needs assistance Equipment used: Rolling walker (2 wheeled) Transfers: Sit to/from Stand Sit to Stand: Modified independent (Device/Increase time)            Ambulation/Gait Ambulation/Gait assistance: Modified independent (Device/Increase time) Gait Distance (Feet): 190 Feet Assistive device: Rolling walker (2 wheeled)       General Gait Details: Pt ambulated with tennis shoes on, improved stability noted and gait  velocity, though still decreased   Stairs             Wheelchair Mobility    Modified Rankin (Stroke Patients Only)       Balance Overall balance assessment: Needs assistance Sitting-balance support: Feet supported Sitting balance-Leahy Scale: Good       Standing balance-Leahy Scale: Fair Standing balance comment: Pt able to wash hands at the sink without UE support                            Cognition Arousal/Alertness: Awake/alert Behavior During Therapy: WFL for tasks assessed/performed Overall Cognitive Status: Within Functional Limits for tasks assessed                                        Exercises Total Joint Exercises Ankle Circles/Pumps: AROM;Both;5 reps Quad Sets: AROM;10 reps;Both Towel Squeeze: AROM;Both;10 reps;Strengthening Short Arc Quad: AROM;10 reps;Strengthening;Both Heel Slides: AAROM;Left;10 reps;AROM;Right;Strengthening Hip ABduction/ADduction: AROM;Right;AAROM;Left;10 reps;Strengthening Other Exercises Other Exercises: Patient able to recall 3/3 precautions at start of session Other Exercises: Pt utilized BSC over standard toilet with supervision, good adherence to precautions    General Comments        Pertinent Vitals/Pain Faces Pain Scale: Hurts a little bit Pain Descriptors / Indicators: Aching;Discomfort;Operative site guarding Pain Intervention(s): Monitored during session;Repositioned;Limited activity within patient's tolerance    Home Living                      Prior Function  PT Goals (current goals can now be found in the care plan section) Progress towards PT goals: Progressing toward goals    Frequency    BID      PT Plan Current plan remains appropriate    Co-evaluation              AM-PAC PT "6 Clicks" Mobility   Outcome Measure  Help needed turning from your back to your side while in a flat bed without using bedrails?: A Little Help needed  moving from lying on your back to sitting on the side of a flat bed without using bedrails?: A Little Help needed moving to and from a bed to a chair (including a wheelchair)?: None Help needed standing up from a chair using your arms (e.g., wheelchair or bedside chair)?: None Help needed to walk in hospital room?: None Help needed climbing 3-5 steps with a railing? : A Little 6 Click Score: 21    End of Session Equipment Utilized During Treatment: Gait belt Activity Tolerance: Patient tolerated treatment well Patient left: in chair;with chair alarm set;with call bell/phone within reach;with SCD's reapplied Nurse Communication: Mobility status PT Visit Diagnosis: Unsteadiness on feet (R26.81);Muscle weakness (generalized) (M62.81);Difficulty in walking, not elsewhere classified (R26.2);Other abnormalities of gait and mobility (R26.89);Pain Pain - Right/Left: Left Pain - part of body: Hip     Time: 0916-0950 PT Time Calculation (min) (ACUTE ONLY): 34 min  Charges:  $Therapeutic Exercise: 23-37 mins                     Olga Coasteriana Maiya Kates PT, DPT 10:05 AM,05/08/19 [phone]

## 2019-05-12 ENCOUNTER — Encounter: Payer: Self-pay | Admitting: Orthopedic Surgery

## 2019-07-25 NOTE — Discharge Instructions (Signed)
Instructions after Total Hip Replacement ° ° °  Laquenta Whitsell P. Ericca Labra, Jr., M.D.    ° Dept. of Orthopaedics & Sports Medicine ° [facility] ° [address] ° [place], [place]  [address] ° Phone: [phone]   Fax: [phone] ° °  °DIET: °• Drink plenty of non-alcoholic fluids. °• Resume your normal diet. Include foods high in fiber. ° °ACTIVITY:  °• You may use crutches or a walker with weight-bearing as tolerated, unless instructed otherwise. °• You may be weaned off of the walker or crutches by your Physical Therapist.  °• Do NOT reach below the level of your knees or cross your legs until allowed.    °• Continue doing gentle exercises. Exercising will reduce the pain and swelling, increase motion, and prevent muscle weakness.   °• Please continue to use the TED compression stockings for 6 weeks. You may remove the stockings at night, but should reapply them in the morning. °• Do not drive or operate any equipment until instructed. ° °WOUND CARE:  °• Continue to use ice packs periodically to reduce pain and swelling. °• Keep the incision clean and dry. °• You may bathe or shower after the staples are removed at the first office visit following surgery. ° °MEDICATIONS: °• You may resume your regular medications. °• Please take the pain medication as prescribed on the medication. °• Do not take pain medication on an empty stomach. °• You have been given a prescription for a blood thinner to prevent blood clots. Please take the medication as instructed. (NOTE: After completing a 2 week course of Lovenox, take one Enteric-coated aspirin once a day.) °• Pain medications and iron supplements can cause constipation. Use a stool softener (Senokot or Colace) on a daily basis and a laxative (dulcolax or miralax) as needed. °• Do not drive or drink alcoholic beverages when taking pain medications. ° °CALL THE OFFICE FOR: °• Temperature above 101 degrees °• Excessive bleeding or drainage on the dressing. °• Excessive  swelling, coldness, or paleness of the toes. °• Persistent nausea and vomiting. ° °FOLLOW-UP:  °• You should have an appointment to return to the office in 6 weeks after surgery. °• Arrangements have been made for continuation of Physical Therapy (either home therapy or outpatient therapy). °  °

## 2019-08-06 ENCOUNTER — Other Ambulatory Visit: Payer: Self-pay

## 2019-08-06 ENCOUNTER — Encounter
Admission: RE | Admit: 2019-08-06 | Discharge: 2019-08-06 | Disposition: A | Discharge status: Home / Self Care | Payer: BC Managed Care – PPO | Source: Ambulatory Visit | Attending: Orthopedic Surgery | Admitting: Orthopedic Surgery

## 2019-08-06 DIAGNOSIS — Z01812 Encounter for preprocedural laboratory examination: Secondary | ICD-10-CM | POA: Diagnosis not present

## 2019-08-06 HISTORY — DX: Other specified health status: Z78.9

## 2019-08-06 LAB — PROTIME-INR
INR: 1 (ref 0.8–1.2)
Prothrombin Time: 13.1 seconds (ref 11.4–15.2)

## 2019-08-06 LAB — COMPREHENSIVE METABOLIC PANEL
ALT: 17 U/L (ref 0–44)
AST: 21 U/L (ref 15–41)
Albumin: 4.5 g/dL (ref 3.5–5.0)
Alkaline Phosphatase: 59 U/L (ref 38–126)
Anion gap: 8 (ref 5–15)
BUN: 24 mg/dL — ABNORMAL HIGH (ref 8–23)
CO2: 28 mmol/L (ref 22–32)
Calcium: 9.8 mg/dL (ref 8.9–10.3)
Chloride: 106 mmol/L (ref 98–111)
Creatinine, Ser: 0.75 mg/dL (ref 0.44–1.00)
GFR calc Af Amer: >60 mL/min (ref >60)
GFR calc non Af Amer: >60 mL/min (ref >60)
Glucose, Bld: 72 mg/dL (ref 70–99)
Potassium: 4.3 mmol/L (ref 3.5–5.1)
Sodium: 142 mmol/L (ref 135–145)
Total Bilirubin: 0.7 mg/dL (ref 0.3–1.2)
Total Protein: 7.2 g/dL (ref 6.5–8.1)

## 2019-08-06 LAB — CBC
HCT: 39.6 % (ref 36.0–46.0)
Hemoglobin: 13.1 g/dL (ref 12.0–15.0)
MCH: 28.9 pg (ref 26.0–34.0)
MCHC: 33.1 g/dL (ref 30.0–36.0)
MCV: 87.4 fL (ref 80.0–100.0)
Platelets: 354 10*3/uL (ref 150–400)
RBC: 4.53 MIL/uL (ref 3.87–5.11)
RDW: 13 % (ref 11.5–15.5)
WBC: 6.2 10*3/uL (ref 4.0–10.5)
nRBC: 0 % (ref 0.0–0.2)

## 2019-08-06 LAB — TYPE AND SCREEN
ABO/RH(D): B POS
Antibody Screen: NEGATIVE

## 2019-08-06 LAB — SURGICAL PCR SCREEN
MRSA, PCR: NEGATIVE
Staphylococcus aureus: NEGATIVE

## 2019-08-06 LAB — URINALYSIS, ROUTINE W REFLEX MICROSCOPIC
Bilirubin Urine: NEGATIVE
Glucose, UA: NEGATIVE mg/dL
Hgb urine dipstick: NEGATIVE
Ketones, ur: NEGATIVE mg/dL
Leukocytes,Ua: NEGATIVE
Nitrite: NEGATIVE
Protein, ur: NEGATIVE mg/dL
Specific Gravity, Urine: 1.017 (ref 1.005–1.030)
pH: 6 (ref 5.0–8.0)

## 2019-08-06 LAB — APTT: aPTT: 32 seconds (ref 24–36)

## 2019-08-06 LAB — C-REACTIVE PROTEIN: CRP: 0.8 mg/dL (ref <1.0)

## 2019-08-06 LAB — SEDIMENTATION RATE: Sed Rate: 15 mm/hr (ref 0–30)

## 2019-08-06 NOTE — Patient Instructions (Signed)
Your procedure is scheduled on: Wed. 12/16 Report to Day Surgery. To find out your arrival time please call [phone] between 1PM - 3PM on Tues 12/15.  Remember: Instructions that are not followed completely may result in serious medical risk,  up to and including death, or upon the discretion of your surgeon and anesthesiologist your  surgery may need to be rescheduled.     _X__ 1. Do not eat food after midnight the night before your procedure.                 No gum chewing or hard candies. You may drink clear liquids up to 2 hours                 before you are scheduled to arrive for your surgery- DO not drink clear                 liquids within 2 hours of the start of your surgery.                 Clear Liquids include:  water, apple juice without pulp, clear carbohydrate                 drink such as Clearfast of Gatorade, Black Coffee or Tea (Do not add                 anything to coffee or tea).  __X__2.  On the morning of surgery brush your teeth with toothpaste and water, you                may rinse your mouth with mouthwash if you wish.  Do not swallow any toothpaste of mouthwash.     _X__ 3.  No Alcohol for 24 hours before or after surgery.   ___ 4.  Do Not Smoke or use e-cigarettes For 24 Hours Prior to Your Surgery.                 Do not use any chewable tobacco products for at least 6 hours prior to                 surgery.  ____  5.  Bring all medications with you on the day of surgery if instructed.   __x__  6.  Notify your doctor if there is any change in your medical condition      (cold, fever, infections).     Do not wear jewelry, make-up, hairpins, clips or nail polish. Do not wear lotions, powders, or perfumes. You may wear deodorant. Do not shave 48 hours prior to surgery. Men may shave face and neck. Do not bring valuables to the hospital.    [facility] is not responsible for any belongings or valuables.  Contacts, dentures  or bridgework may not be worn into surgery. Leave your suitcase in the car. After surgery it may be brought to your room. For patients admitted to the hospital, discharge time is determined by your treatment team.   Patients discharged the day of surgery will not be allowed to drive home.   Please read over the following fact sheets that you were given:    ____ Take these medicines the morning of surgery with A SIP OF WATER:    1. none  2.   3.   4.  5.  6.  ____ Fleet Enema (as directed)   __x__ Use CHG Soap as directed  ____ Use inhalers on the day of  surgery  ____ Stop metformin 2 days prior to surgery    ____ Take 1/2 of usual insulin dose the night before surgery. No insulin the morning          of surgery.   ____ Stop Coumadin/Plavix/aspirin   __x__ Stop Anti-inflammatories No ibuprofen aleve or aspirin until after the surgery.  May take tylenol   __x__ Stop supplements until after surgery.  Vit c  ____ Bring C-Pap to the hospital.

## 2019-08-07 LAB — URINE CULTURE
Culture: NO GROWTH
Special Requests: NORMAL

## 2019-08-14 ENCOUNTER — Other Ambulatory Visit: Admission: RE | Admit: 2019-08-14 | Payer: BC Managed Care – PPO | Source: Ambulatory Visit

## 2019-08-19 ENCOUNTER — Inpatient Hospital Stay: Admit: 2019-08-19 | Payer: BC Managed Care – PPO | Admitting: Orthopedic Surgery

## 2019-08-19 SURGERY — ARTHROPLASTY, HIP, TOTAL,POSTERIOR APPROACH
Anesthesia: Choice | Site: Hip | Laterality: Right

## 2019-09-15 ENCOUNTER — Other Ambulatory Visit: Payer: BC Managed Care – PPO

## 2019-09-16 ENCOUNTER — Other Ambulatory Visit: Admission: RE | Admit: 2019-09-16 | Payer: BC Managed Care – PPO | Source: Ambulatory Visit

## 2019-09-18 ENCOUNTER — Inpatient Hospital Stay: Admit: 2019-09-18 | Payer: BC Managed Care – PPO | Admitting: Orthopedic Surgery

## 2019-09-18 SURGERY — ARTHROPLASTY, HIP, TOTAL,POSTERIOR APPROACH
Anesthesia: Choice | Site: Hip | Laterality: Right

## 2019-10-13 NOTE — Discharge Instructions (Signed)
Instructions after Total Hip Replacement     Alicia Calderon, Jr., M.D.     Dept. of Orthopaedics & Sports Medicine  [facility]  [address]  [place], [place]  [address]  Phone: [phone]   Fax: [phone]    DIET: . Drink plenty of non-alcoholic fluids. . Resume your normal diet. Include foods high in fiber.  ACTIVITY:  . You may use crutches or a walker with weight-bearing as tolerated, unless instructed otherwise. . You may be weaned off of the walker or crutches by your Physical Therapist.  . Do NOT reach below the level of your knees or cross your legs until allowed.    . Continue doing gentle exercises. Exercising will reduce the pain and swelling, increase motion, and prevent muscle weakness.   . Please continue to use the TED compression stockings for 6 weeks. You may remove the stockings at night, but should reapply them in the morning. . Do not drive or operate any equipment until instructed.  WOUND CARE:  . Continue to use ice packs periodically to reduce pain and swelling. . Keep the incision clean and dry. . You may bathe or shower after the staples are removed at the first office visit following surgery.  MEDICATIONS: . You may resume your regular medications. . Please take the pain medication as prescribed on the medication. . Do not take pain medication on an empty stomach. . You have been given a prescription for a blood thinner to prevent blood clots. Please take the medication as instructed. (NOTE: After completing a 2 week course of Lovenox, take one Enteric-coated aspirin once a day.) . Pain medications and iron supplements can cause constipation. Use a stool softener (Senokot or Colace) on a daily basis and a laxative (dulcolax or miralax) as needed. . Do not drive or drink alcoholic beverages when taking pain medications.  CALL THE OFFICE FOR: . Temperature above 101 degrees . Excessive bleeding or drainage on the dressing. . Excessive  swelling, coldness, or paleness of the toes. . Persistent nausea and vomiting.  FOLLOW-UP:  . You should have an appointment to return to the office in 6 weeks after surgery. . Arrangements have been made for continuation of Physical Therapy (either home therapy or outpatient therapy).     [facility] Department Directory         [email or web address]       [email or web address]          Cardiology  Appointments: [place] - [phone] [place] - [phone]  Endocrinology  Appointments: [place] - [phone] [place] - [phone]  Gastroenterology  Appointments: [place] - [phone] [place] - [phone]        General Surgery   Appointments: [place] - [phone]  Internal Medicine/Family Medicine  Appointments: [place] - [phone] [place] - [phone] [place] - [phone]  Metabolic and Weigh Loss Surgery  Appointments: [place] - [phone]        Neurology  Appointments: [place] - [phone] [place] - [phone]  Neurosurgery  Appointments: [place] - [phone]  Obstetrics & Gynecology  Appointments: [place] - [phone] [place] - [phone]        Pediatrics  Appointments: [place] - [phone] [place] - [phone]  Physiatry  Appointments: [place] -[phone]  Physical Therapy  Appointments: [place] - [phone] [place] - [phone]        Podiatry  Appointments: [place] - [phone] [place] - [phone]  Pulmonology  Appointments: [place] - [phone]  Rheumatology  Appointments: [place] - [phone]        [place] Location: [facility]  [address] [place], [place]  [address]  [place] Location: [facility] [address] [place], [place]  [address]  [place] Location: [facility] [address] [place],   [address]    

## 2019-10-20 ENCOUNTER — Other Ambulatory Visit: Payer: Self-pay

## 2019-10-20 ENCOUNTER — Encounter
Admission: RE | Admit: 2019-10-20 | Discharge: 2019-10-20 | Disposition: A | Discharge status: Home / Self Care | Payer: BC Managed Care – PPO | Source: Ambulatory Visit | Attending: Orthopedic Surgery | Admitting: Orthopedic Surgery

## 2019-10-20 HISTORY — DX: Polyp of colon: K63.5

## 2019-10-20 HISTORY — DX: Unspecified osteoarthritis, unspecified site: M19.90

## 2019-10-20 HISTORY — DX: Other specified disorders of bone density and structure, unspecified site: M85.80

## 2019-10-20 NOTE — Patient Instructions (Signed)
Your procedure is scheduled on:Mon 2/22 Report to Day Surgery. To find out your arrival time please call [phone] between 1PM - 3PM on Friday 2/19.  Remember: Instructions that are not followed completely may result in serious medical risk,  up to and including death, or upon the discretion of your surgeon and anesthesiologist your  surgery may need to be rescheduled.     _X__ 1. Do not eat food after midnight the night before your procedure.                 No gum chewing or hard candies. You may drink clear liquids up to 2 hours                 before you are scheduled to arrive for your surgery- DO not drink clear                 liquids within 2 hours of the start of your surgery.                 Clear Liquids include:  water, apple juice without pulp, clear Gatorade, G2 or                  Gatorade Zero (avoid Red/Purple/Blue), Black Coffee or Tea (Do not add                 anything to coffee or tea). __x___2.   Complete the carbohydrate drink provided to you, 2 hours before arrival.  __X__2.  On the morning of surgery brush your teeth with toothpaste and water, you                may rinse your mouth with mouthwash if you wish.  Do not swallow any toothpaste of mouthwash.     _X__ 3.  No Alcohol for 24 hours before or after surgery.   _X__ 4.  Do Not Smoke or use e-cigarettes For 24 Hours Prior to Your Surgery.                 Do not use any chewable tobacco products for at least 6 hours prior to                 surgery.  ____  5.  Bring all medications with you on the day of surgery if instructed.   _x___  6.  Notify your doctor if there is any change in your medical condition      (cold, fever, infections).     Do not wear jewelry, make-up, hairpins, clips or nail polish. Do not wear lotions, powders, or perfumes. You may wear deodorant. Do not shave 48 hours prior to surgery. Men may shave face and neck. Do not bring valuables to the hospital.     [facility] is not responsible for any belongings or valuables.  Contacts, dentures or bridgework may not be worn into surgery. Leave your suitcase in the car. After surgery it may be brought to your room. For patients admitted to the hospital, discharge time is determined by your treatment team.   Patients discharged the day of surgery will not be allowed to drive home.   Make arrangements for someone to be with you for the first 24 hours of your Same Day Discharge.    Please read over the following fact sheets that you were given:       _x___ Take these medicines the morning of surgery with A SIP OF WATER:  1. May take tylenol if needed  2.   3.   4.  5.  6.  ____ Fleet Enema (as directed)   __x__ Use CHG Soap (or wipes) as directed  ____ Use Benzoyl Peroxide Gel as instructed  ____ Use inhalers on the day of surgery  ____ Stop metformin 2 days prior to surgery    ____ Take 1/2 of usual insulin dose the night before surgery. No insulin the morning          of surgery.   ____ Stop Coumadin/Plavix/aspirin on   _x___ Stop Anti-inflammatories  no ibuprofen aleve of aspirin products until after surgery   _x___ Stop supplements until after surgery.  Ascorbic Acid (VITAMIN C) 1000 MG tablet  ____ Bring C-Pap to the hospital.

## 2019-10-20 NOTE — Pre-Procedure Instructions (Signed)
Dr. Ernest Pine (per secure chat) stated repeat labs were not necessary since patient had recent labs and EKG.  Will repeat Type and Screen and MRSA only.

## 2019-10-21 ENCOUNTER — Other Ambulatory Visit
Admission: RE | Admit: 2019-10-21 | Discharge: 2019-10-21 | Disposition: A | Discharge status: Home / Self Care | Payer: BC Managed Care – PPO | Source: Ambulatory Visit | Attending: Orthopedic Surgery | Admitting: Orthopedic Surgery

## 2019-10-21 ENCOUNTER — Emergency Department: Payer: BC Managed Care – PPO

## 2019-10-21 ENCOUNTER — Other Ambulatory Visit: Payer: Self-pay

## 2019-10-21 ENCOUNTER — Emergency Department
Admission: EM | Admit: 2019-10-21 | Discharge: 2019-10-21 | Disposition: A | Discharge status: Home / Self Care | Payer: BC Managed Care – PPO | Attending: Student | Admitting: Student

## 2019-10-21 DIAGNOSIS — L03032 Cellulitis of left toe: Secondary | ICD-10-CM | POA: Insufficient documentation

## 2019-10-21 DIAGNOSIS — Z79899 Other long term (current) drug therapy: Secondary | ICD-10-CM | POA: Diagnosis not present

## 2019-10-21 DIAGNOSIS — Z96642 Presence of left artificial hip joint: Secondary | ICD-10-CM | POA: Diagnosis not present

## 2019-10-21 DIAGNOSIS — B999 Unspecified infectious disease: Secondary | ICD-10-CM

## 2019-10-21 DIAGNOSIS — Z20822 Contact with and (suspected) exposure to covid-19: Secondary | ICD-10-CM | POA: Diagnosis not present

## 2019-10-21 DIAGNOSIS — M79675 Pain in left toe(s): Secondary | ICD-10-CM | POA: Diagnosis present

## 2019-10-21 LAB — CBC WITH DIFFERENTIAL/PLATELET
Abs Immature Granulocytes: 0.02 10*3/uL (ref 0.00–0.07)
Basophils Absolute: 0.1 10*3/uL (ref 0.0–0.1)
Basophils Relative: 1 %
Eosinophils Absolute: 0.1 10*3/uL (ref 0.0–0.5)
Eosinophils Relative: 1 %
HCT: 38.2 % (ref 36.0–46.0)
Hemoglobin: 12.4 g/dL (ref 12.0–15.0)
Immature Granulocytes: 0 %
Lymphocytes Relative: 39 %
Lymphs Abs: 3.4 10*3/uL (ref 0.7–4.0)
MCH: 29 pg (ref 26.0–34.0)
MCHC: 32.5 g/dL (ref 30.0–36.0)
MCV: 89.5 fL (ref 80.0–100.0)
Monocytes Absolute: 0.5 10*3/uL (ref 0.1–1.0)
Monocytes Relative: 6 %
Neutro Abs: 4.7 10*3/uL (ref 1.7–7.7)
Neutrophils Relative %: 53 %
Platelets: 337 10*3/uL (ref 150–400)
RBC: 4.27 MIL/uL (ref 3.87–5.11)
RDW: 14 % (ref 11.5–15.5)
WBC: 8.7 10*3/uL (ref 4.0–10.5)
nRBC: 0 % (ref 0.0–0.2)

## 2019-10-21 LAB — COMPREHENSIVE METABOLIC PANEL
ALT: 20 U/L (ref 0–44)
AST: 20 U/L (ref 15–41)
Albumin: 4.3 g/dL (ref 3.5–5.0)
Alkaline Phosphatase: 54 U/L (ref 38–126)
Anion gap: 8 (ref 5–15)
BUN: 27 mg/dL — ABNORMAL HIGH (ref 8–23)
CO2: 26 mmol/L (ref 22–32)
Calcium: 9.5 mg/dL (ref 8.9–10.3)
Chloride: 102 mmol/L (ref 98–111)
Creatinine, Ser: 0.86 mg/dL (ref 0.44–1.00)
GFR calc Af Amer: >60 mL/min (ref >60)
GFR calc non Af Amer: >60 mL/min (ref >60)
Glucose, Bld: 119 mg/dL — ABNORMAL HIGH (ref 70–99)
Potassium: 3.8 mmol/L (ref 3.5–5.1)
Sodium: 136 mmol/L (ref 135–145)
Total Bilirubin: 0.6 mg/dL (ref 0.3–1.2)
Total Protein: 7.3 g/dL (ref 6.5–8.1)

## 2019-10-21 LAB — TYPE AND SCREEN
ABO/RH(D): B POS
Antibody Screen: NEGATIVE

## 2019-10-21 LAB — SEDIMENTATION RATE: Sed Rate: 11 mm/hr (ref 0–30)

## 2019-10-21 MED ORDER — SODIUM CHLORIDE 0.9% FLUSH: 3.0000 mL | Freq: Once | INTRAVENOUS | Status: DC

## 2019-10-21 MED ORDER — CEPHALEXIN 500 MG PO CAPS: 500.0000 mg | ORAL_CAPSULE | Freq: Four times a day (QID) | ORAL | 0 refills | Status: AC

## 2019-10-21 MED ORDER — VANCOMYCIN HCL 1500 MG/300ML IV SOLN
1500.0000 mg | Freq: Once | INTRAVENOUS | Status: AC
  Administered 2019-10-21: 1500 mg via INTRAVENOUS
  Filled 2019-10-21: qty 300

## 2019-10-21 MED ORDER — SODIUM CHLORIDE 0.9 % IV SOLN
1.0000 g | Freq: Once | INTRAVENOUS | Status: AC
  Administered 2019-10-21: 1 g via INTRAVENOUS
  Filled 2019-10-21: qty 10

## 2019-10-21 NOTE — Consult Note (Signed)
PHARMACY -  BRIEF ANTIBIOTIC NOTE   Pharmacy has received consult(s) for Vancomycin from an ED provider.  The patient's profile has been reviewed for ht/wt/allergies/indication/available labs.    One time order(s) placed for Vancomycin 1,500mg  IV x 1 dose.  Further antibiotics/pharmacy consults should be ordered by admitting physician if indicated.                       Thank you, Bettey Costa 10/21/2019  7:50 PM

## 2019-10-21 NOTE — ED Triage Notes (Signed)
Pt comes via POV from home with c/o left foot infection. Pt states she had her big toe nail removed by her Podiatrist. Pt states she was started on antibiotics.  Pt states she thought it was improving. Pt states her doctors told her she needed to come today to get IV antibiotics.  Pt denies any drainage. Pt denies any hx of diabetes.

## 2019-10-21 NOTE — Discharge Instructions (Addendum)
Thank you for letting us take care of you in the emergency department today.   Please continue to take any regular, prescribed medications.   New medications we have prescribed:  - Keflex - an antibiotic to help with skin infection  Please follow up with: - Your orthopedic/podiatry doctor as needed to review your ER visit and follow up on your symptoms.   Please return to the ER for any new or worsening symptoms.

## 2019-10-21 NOTE — ED Provider Notes (Signed)
[facility] Emergency Department Provider Note  ____________________________________________   First MD Initiated Contact with Patient 10/21/19 1851     (approximate)  I have reviewed the triage vital signs and the nursing notes.  History  Chief Complaint foot infection    HPI Alicia Calderon is a 64 y.o. female PMHx as below who presents to the ED by recommendation of her podiatrist & orthopedist for a dose of IV antibiotics for toe cellulitis.   Patient initially had left great toenail removed by podiatry on 1/13 due to ingrown nail with associated infection/abscess at the proximal nail fold. Nail was removed by podiatry with some purulence expressed and given course of Bactrim which she completed.   Followed up on 1/22. Per chart review her exam appeared to be stable/as expected, though she was instructed to also complete a 7 day course of doxycyline at that time, which she completed.   She saw her orthopedic MD yesterday for a pre-op visit for scheduled hip replacement next week. During the visit she mentioned her healing toe. The orthopedic team discussed with podiatry, who recommended a dose of IV antibiotics, though the patient says she is not 100% clear as to why, as she feels she is doing well from this perspective.   Patient states the affected area has been healing well and improving, she has no acute concerns related to the area. No drainage. Mild erythema at the nail fold, but patient states this is much improved compared to prior. No pain. Able to flex at MTP and IP w/o issue. No fevers, nausea, vomiting.    Past Medical Hx Past Medical History:  Diagnosis Date  . Arthritis   . Colon polyp   . Medical history non-contributory   . Osteopenia     Problem List Patient Active Problem List   Diagnosis Date Noted  . Status post total hip replacement, left 05/07/2019  . H/O total hip arthroplasty 05/06/2019  . Primary osteoarthritis of both  hips 02/23/2019  . Osteopenia 09/05/2018    Past Surgical Hx Past Surgical History:  Procedure Laterality Date  . COLONOSCOPY W/ POLYPECTOMY    . COLONOSCOPY WITH PROPOFOL  10/2018  . JOINT REPLACEMENT Left    hip  . TOTAL HIP ARTHROPLASTY Left 05/06/2019   Procedure: TOTAL HIP ARTHROPLASTY;  Surgeon: Dereck Leep, MD;  Location: [facility] ORS;  Service: Orthopedics;  Laterality: Left;  . WISDOM TOOTH EXTRACTION      Medications Prior to Admission medications   Medication Sig Start Date End Date Taking? Authorizing Provider  Ascorbic Acid (VITAMIN C) 1000 MG tablet Take 1,000 mg by mouth daily.   Yes [provider]  b complex vitamins tablet Take 1 tablet by mouth daily.   Yes [provider]  Calcium-Magnesium 500-250 MG TABS Take 1 tablet by mouth daily at 6 (six) AM.   Yes [provider]  cholecalciferol (VITAMIN D3) 25 MCG (1000 UT) tablet Take 2,000 Units by mouth daily.   Yes [provider]  zinc gluconate 50 MG tablet Take 50 mg by mouth daily.   Yes [provider]  acetaminophen (TYLENOL) 325 MG tablet Take 650 mg by mouth every 6 (six) hours as needed for moderate pain or headache.    [provider]  docusate sodium (COLACE) 50 MG capsule Take 50 mg by mouth at bedtime as needed for mild constipation.    [provider]  ibuprofen (ADVIL) 200 MG tablet Take 400 mg by mouth  every 6 (six) hours as needed for headache or moderate pain.    [provider]    Allergies Patient has no known allergies.  Family Hx No family history on file.  Social Hx Social History   Tobacco Use  . Smoking status: Never Smoker  . Smokeless tobacco: Never Used  Substance Use Topics  . Alcohol use: Not Currently  . Drug use: Not Currently     Review of Systems  Constitutional: Negative for fever, chills. Eyes: Negative for visual changes. ENT: Negative for sore throat. Cardiovascular: Negative for chest  pain. Respiratory: Negative for shortness of breath. Gastrointestinal: Negative for nausea, vomiting.  Genitourinary: Negative for dysuria. Musculoskeletal: Negative for leg swelling. Skin: Positive for L toe cellulitis.  Neurological: Negative for headaches.   Physical Exam  Vital Signs: ED Triage Vitals [10/21/19 1751]  Enc Vitals Group     BP (!) 148/86     Pulse Rate 88     Resp 18     Temp 98.8 F (37.1 C)     Temp src      SpO2 100 %     Weight 144 lb (65.3 kg)     Height _0  (1.702 m)     Head Circumference      Peak Flow      Pain Score 0     Pain Loc      Pain Edu?      Excl. in [place]?     Constitutional: Alert and oriented.  Head: Normocephalic. Atraumatic. Eyes: Conjunctivae clear. Sclera anicteric. Nose: No congestion. No rhinorrhea. Mouth/Throat: Wearing mask.  Neck: No stridor.   Cardiovascular: Normal rate, regular rhythm. Extremities well perfused. 2+ L DP pulse by palpation. Toes WWP. Cap refill < 2 seconds.  Respiratory: Normal respiratory effort.   Musculoskeletal: LLE: mild erythema along the nail fold of the L great toe. Expected scabbing of the nail bed s/p nail removal. No drainage. No fluctuance. No crepitance. Able to flex and extend at MTP and IP without pain.  Neurologic:  Normal speech and language. No gross focal neurologic deficits are appreciated.  Skin: See MSK above.  Psychiatric: Mood and affect are appropriate for situation.  Radiology  XR great toe LEFT: IMPRESSION:  No acute osseous abnormality    Procedures  Procedure(s) performed (including critical care):  Procedures   Initial Impression / Assessment and Plan / ED Course  64 y.o. female who presents to the ED for antibiotics, as instructed by her podiatrist/orthopedist, for L toe cellulitis. Full detailed hx as above.   Exam here seem to be reassuring. Mild erythema, cellulitis at the nail fold, but no evidence of abscess or deep space infection. Able to range at the  joint, therefore doubt infected or septic joint or gout.   Labs reassuring, normal WBC count, normal ESR. XR negative. No fever.   Reviewed culture from original procedure grew C acnes. Given her outpatient providers referral and out of abundance of caution given her upcoming surgery (and as we cannot see what her original exam was) will opt to cover with dose of IV antibiotics here and then plan for course of Keflex which will cover the original culture. Patient agreeable with plan. Advised touching base with orthopedics tomorrow.   Final Clinical Impression(s) / ED Diagnosis  Final diagnoses:  Cellulitis of toe of left foot       Note:  This document was prepared using Dragon voice recognition software and may include unintentional dictation errors.  Lilia Pro., MD 10/22/19 [place]

## 2019-10-22 ENCOUNTER — Other Ambulatory Visit: Payer: BC Managed Care – PPO

## 2019-10-22 LAB — C-REACTIVE PROTEIN: CRP: 0.6 mg/dL (ref <1.0)

## 2019-10-22 LAB — SARS CORONAVIRUS 2 (TAT 6-24 HRS): SARS Coronavirus 2: NEGATIVE

## 2019-10-25 ENCOUNTER — Encounter: Payer: Self-pay | Admitting: Orthopedic Surgery

## 2019-10-25 MED ORDER — TRANEXAMIC ACID-NACL 1000-0.7 MG/100ML-% IV SOLN
1000.0000 mg | Freq: Once | INTRAVENOUS | Status: AC
  Administered 2019-10-26: 1000 mg via INTRAVENOUS

## 2019-10-25 MED ORDER — TRANEXAMIC ACID-NACL 1000-0.7 MG/100ML-% IV SOLN
1000.0000 mg | INTRAVENOUS | Status: AC
  Administered 2019-10-26: 1000 mg via INTRAVENOUS

## 2019-10-25 NOTE — H&P (Signed)
ORTHOPAEDIC HISTORY & PHYSICAL  Progress Notes by Gwenlyn Fudge, PA at 10/20/2019 1:45 PM  [facility] Chief Complaint:       Chief Complaint  Patient presents with  . Hip Pain    H & P RIGHT HIP    History of Present Illness:    Alicia Calderon is a 65 y.o. female that presents to clinic today for her preoperative history and evaluation.  Patient presents unaccompanied. The patient is scheduled to undergo a right total hip arthroplasty on 10/26/19 by Dr. Marry Guan. Herpain began several years ago.The pain is located in the groin as well as the lateral thigh and buttocks.Shereports associated difficulty navigating stairs and start up stiffness.Shedenies associated numbness or tingling.   The patient's symptoms have progressed to the point that they decreaseherquality of life. The patient has previously undergone conservative treatment including NSAIDS and activity modification without adequate control of hersymptoms.  Patient denies any history of blood clots or significant cardiac history. She does state that she would like to do outpatient therapy to increase her strength after she completes the home health therapy following surgery.  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 Marry Guan. Patient states the left hip is doing very well. She has no complaints.  Of note, patient was previously seen by podiatry for cellulitis and abscess of the left great toe.  Patient states she has completed 2 courses of antibiotics and the toe is much improved.  Past Medical, Surgical, Family, Social History, Allergies, Medications:   Past Medical History:      Past Medical History:  Diagnosis Date  . Colon polyp   . Osteopenia 09/05/2018    Past Surgical History:       Past Surgical History:  Procedure Laterality Date  . 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 COLONOSCOPY  01/14/2013, 09/27/2009   PH Adenomatous Polyp: [facility]BF 01/2018; Recall Ltr mailed 11/27/2017 (dh)  . COLONOSCOPY  10/07/2018   PH Adenomatous Polyp: [facility]BF 10/2023  . EXTRACTION TEETH     WISDOM  . FLEXIBLE SIGMOIDOSCOPY  11/15/2008  . Left total hip arthroplasty  05/06/2019   Dr Marry Guan    Current Medications:  Current Medications        Current Outpatient Medications  Medication Sig Dispense Refill  . ascorbic acid, vitamin C, (VITAMIN C) 1000 MG tablet Take 1,000 mg by mouth once daily       . Ca carb-Ca gluc-Mg ox-Mg gluco (CALCIUM MAGNESIUM) 500 mg calcium -250 mg Tab 1 tablet once daily       . cholecalciferol (VITAMIN D3) 1000 unit tablet Take 1,000 Units by mouth once daily       . docusate (COLACE) 50 MG capsule Take 50 mg by mouth once daily as needed       . vitamin B complex-folic acid 0.4 mg Tab Take 1 tablet by mouth once daily       . zinc gluconate 50 mg tablet Take 50 mg by mouth once daily        No current facility-administered medications for this visit.       Allergies: No Known Allergies  Social History:  Social History  Social History        Socioeconomic History  . Marital status: Single    Spouse name: Not on file  . Number of children: 0  . Years  of education: 37  . Highest education level: Not on file  Occupational History  . Occupation: Retired  Engineer, production  . Financial resource strain: Not on file  . Food insecurity    Worry: Not on file    Inability: Not on file  . Transportation needs    Medical: Not on file    Non-medical: Not on file  Tobacco Use  . Smoking status: Never Smoker  . Smokeless tobacco: Never Used  Substance and Sexual Activity  . Alcohol use: Yes    Alcohol/week: 1.0 standard drinks    Types: 1 Cans of beer per week    Frequency: 2-4 times a month    Drinks per session: 1 or 2    Binge frequency: Never    Comment: 1 Beer  weekly  . Drug use: Never  . Sexual activity: Defer  Lifestyle  . Physical activity    Days per week: Not on file    Minutes per session: Not on file  . Stress: Not on file  Relationships  . Social Musician on phone: Not on file    Gets together: Not on file    Attends religious service: Not on file    Active member of club or organization: Not on file    Attends meetings of clubs or organizations: Not on file    Relationship status: Not on file  Other Topics Concern  . Not on file  Social History Narrative  . Not on file      Family History:       Family History  Problem Relation Age of Onset  . Skin cancer Mother   . Prostate cancer Father   . Prostate cancer Maternal Grandfather   . Colon polyps Neg Hx   . Colon cancer Neg Hx     Review of Systems:   A 10+ ROS was performed, reviewed, and the pertinent orthopaedic findings are documented in the HPI.    Physical Examination:   BP 130/82   Ht 170.2 cm (5\' 7" )   Wt 66 kg (145 lb 9.6 oz)   BMI 22.80 kg/m   Patient is a well-developed, well-nourished female in no acute distress. Patient has normal mood and affect. Patient is alert and oriented to person, place, and time.   HEENT: Atraumatic, normocephalic.  Pupils equal and reactive to light.  Extraocular motion intact.  Noninjected sclera.  Cardiovascular: Regular rate and rhythm, with no murmurs, rubs, or gallops.  Distal pulses palpable.  Respiratory: Lungs clear to auscultation bilaterally.   RightHip: Soft tissue swelling:Negative Erythema:Negative Crepitance:Negative Tenderness:Greater trochanter is nontender to palpation. Moderatepain is elicited by axial compression or extremes of rotation. Atrophy:No atrophy. Fair to goodhip flexor and abductor strength. Range of Motion:EXT/FLEX: 0/0/110ADD/ABD:  20/0/20IR/ER: 0/0/110   Tests Performed/Reviewed:  X-rays  No new radiographs were obtained today. Previous radiographs were reviewed of the right hip and revealed loss of femoral acetabular joint space with bone-on-bone contact and subchondral sclerosis of the bone noted. Significant osteophyte formation noted. No fractures noted. Left total hip arthroplasty is noted on the AP view of the pelvis.  Impression:     ICD-10-CM  1. Primary osteoarthritis of right hip  M16.11  2. Status post total replacement of left hip  Z96.642   Plan:   The patient has end-stage degenerative changes of the right hip.  It was explained to the patient that the condition is progressive in nature.  Having failed conservative treatment, the patient  has elected to proceed with a total joint arthroplasty.  The patient will undergo a total joint arthroplasty with Dr. Ernest Pine.  The risks of surgery, including blood clot and infection, were discussed with the patient.  Measures to reduce these risks, including the use of anticoagulation, perioperative antibiotics, and early ambulation were discussed.  The importance of postoperative physical therapy was discussed with the patient. The patient elects to proceed with surgery. The patient is instructed to stop all blood thinners prior to surgery.  The patient is instructed to call the hospital the day before surgery to learn of the proper arrival time.    Contact our office with any questions or concerns.  Follow up as indicated, or sooner should any new problems arise, if conditions worsen, or if they are otherwise concerned.   Michelene Gardener, PA [facility] Orthopaedics and Sports Medicine [address] [place], [place] [address] Phone: [phone]  This note was generated in part with voice recognition software and I apologize for any typographical errors that were not detected and corrected.     Electronically signed  by Michelene Gardener, PA on 10/20/2019 3:20 PM

## 2019-10-26 ENCOUNTER — Other Ambulatory Visit: Payer: Self-pay

## 2019-10-26 ENCOUNTER — Inpatient Hospital Stay: Payer: BC Managed Care – PPO | Admitting: Registered Nurse

## 2019-10-26 ENCOUNTER — Encounter
Admission: RE | Disposition: A | Discharge status: Home Health | Payer: Self-pay | Source: Home / Self Care | Attending: Orthopedic Surgery

## 2019-10-26 ENCOUNTER — Inpatient Hospital Stay
Admission: RE | Admit: 2019-10-26 | Discharge: 2019-10-28 | DRG: 470 | Disposition: A | Discharge status: Home Health | Payer: BC Managed Care – PPO | Attending: Orthopedic Surgery | Admitting: Orthopedic Surgery

## 2019-10-26 ENCOUNTER — Encounter: Payer: Self-pay | Admitting: Orthopedic Surgery

## 2019-10-26 ENCOUNTER — Inpatient Hospital Stay: Payer: BC Managed Care – PPO

## 2019-10-26 DIAGNOSIS — M1611 Unilateral primary osteoarthritis, right hip: Secondary | ICD-10-CM | POA: Diagnosis present

## 2019-10-26 DIAGNOSIS — M8580 Other specified disorders of bone density and structure, unspecified site: Secondary | ICD-10-CM | POA: Diagnosis present

## 2019-10-26 DIAGNOSIS — Z808 Family history of malignant neoplasm of other organs or systems: Secondary | ICD-10-CM

## 2019-10-26 DIAGNOSIS — Z96649 Presence of unspecified artificial hip joint: Secondary | ICD-10-CM

## 2019-10-26 DIAGNOSIS — Z96641 Presence of right artificial hip joint: Secondary | ICD-10-CM

## 2019-10-26 DIAGNOSIS — Z96642 Presence of left artificial hip joint: Secondary | ICD-10-CM | POA: Diagnosis present

## 2019-10-26 DIAGNOSIS — Z8042 Family history of malignant neoplasm of prostate: Secondary | ICD-10-CM

## 2019-10-26 HISTORY — PX: TOTAL HIP ARTHROPLASTY: SHX124

## 2019-10-26 SURGERY — ARTHROPLASTY, HIP, TOTAL,POSTERIOR APPROACH
Anesthesia: Spinal | Site: Hip | Laterality: Right

## 2019-10-26 MED ORDER — TRANEXAMIC ACID-NACL 1000-0.7 MG/100ML-% IV SOLN
INTRAVENOUS | Status: AC
  Filled 2019-10-26: qty 100

## 2019-10-26 MED ORDER — ACETAMINOPHEN 10 MG/ML IV SOLN
INTRAVENOUS | Status: AC
  Filled 2019-10-26: qty 100

## 2019-10-26 MED ORDER — PANTOPRAZOLE SODIUM 40 MG PO TBEC
40.0000 mg | DELAYED_RELEASE_TABLET | Freq: Two times a day (BID) | ORAL | Status: DC
  Administered 2019-10-26 – 2019-10-28 (×4): 40 mg via ORAL
  Filled 2019-10-26 (×4): qty 1

## 2019-10-26 MED ORDER — CEFAZOLIN SODIUM-DEXTROSE 2-4 GM/100ML-% IV SOLN
2.0000 g | INTRAVENOUS | Status: AC
  Administered 2019-10-26: 2 g via INTRAVENOUS

## 2019-10-26 MED ORDER — BUPIVACAINE HCL (PF) 0.5 % IJ SOLN
INTRAMUSCULAR | Status: AC
  Filled 2019-10-26: qty 10

## 2019-10-26 MED ORDER — MENTHOL 3 MG MT LOZG
1.0000 | LOZENGE | OROMUCOSAL | Status: DC | PRN
  Filled 2019-10-26: qty 9

## 2019-10-26 MED ORDER — ENSURE PRE-SURGERY PO LIQD
296.0000 mL | Freq: Once | ORAL | Status: DC
  Filled 2019-10-26: qty 296

## 2019-10-26 MED ORDER — OXYCODONE HCL 5 MG PO TABS
5.0000 mg | ORAL_TABLET | ORAL | Status: DC | PRN
  Administered 2019-10-27: 5 mg via ORAL
  Filled 2019-10-26: qty 1

## 2019-10-26 MED ORDER — PHENOL 1.4 % MT LIQD
1.0000 | OROMUCOSAL | Status: DC | PRN
  Filled 2019-10-26: qty 177

## 2019-10-26 MED ORDER — METOCLOPRAMIDE HCL 5 MG/ML IJ SOLN: 5.0000 mg | Freq: Three times a day (TID) | INTRAMUSCULAR | Status: DC | PRN

## 2019-10-26 MED ORDER — MAGNESIUM OXIDE 400 (241.3 MG) MG PO TABS
200.0000 mg | ORAL_TABLET | Freq: Every day | ORAL | Status: DC
  Administered 2019-10-27 – 2019-10-28 (×2): 200 mg via ORAL
  Filled 2019-10-26 (×2): qty 1

## 2019-10-26 MED ORDER — KETOROLAC TROMETHAMINE 0.5 % OP SOLN
1.0000 [drp] | Freq: Three times a day (TID) | OPHTHALMIC | Status: DC | PRN
  Filled 2019-10-26: qty 3

## 2019-10-26 MED ORDER — CELECOXIB 200 MG PO CAPS
200.0000 mg | ORAL_CAPSULE | Freq: Two times a day (BID) | ORAL | Status: DC
  Administered 2019-10-26 – 2019-10-28 (×4): 200 mg via ORAL
  Filled 2019-10-26 (×4): qty 1

## 2019-10-26 MED ORDER — GABAPENTIN 300 MG PO CAPS
ORAL_CAPSULE | ORAL | Status: AC
  Administered 2019-10-26: 300 mg via ORAL
  Filled 2019-10-26: qty 1

## 2019-10-26 MED ORDER — NEOMYCIN-POLYMYXIN B GU 40-200000 IR SOLN
Status: DC | PRN
  Administered 2019-10-26: 14 mL

## 2019-10-26 MED ORDER — PHENYLEPHRINE HCL (PRESSORS) 10 MG/ML IV SOLN
INTRAVENOUS | Status: DC | PRN
  Administered 2019-10-26: 50 ug via INTRAVENOUS
  Administered 2019-10-26: 100 ug via INTRAVENOUS
  Administered 2019-10-26: 150 ug via INTRAVENOUS

## 2019-10-26 MED ORDER — FENTANYL CITRATE (PF) 100 MCG/2ML IJ SOLN: 25.0000 ug | INTRAMUSCULAR | Status: DC | PRN

## 2019-10-26 MED ORDER — OXYCODONE HCL 5 MG PO TABS
10.0000 mg | ORAL_TABLET | ORAL | Status: DC | PRN
  Administered 2019-10-26: 10 mg via ORAL
  Filled 2019-10-26: qty 2

## 2019-10-26 MED ORDER — SODIUM CHLORIDE (PF) 0.9 % IJ SOLN
INTRAMUSCULAR | Status: AC
  Filled 2019-10-26: qty 10

## 2019-10-26 MED ORDER — BUPIVACAINE HCL (PF) 0.5 % IJ SOLN
INTRAMUSCULAR | Status: DC | PRN
  Administered 2019-10-26: 3 mL via INTRATHECAL

## 2019-10-26 MED ORDER — DEXAMETHASONE SODIUM PHOSPHATE 10 MG/ML IJ SOLN: 8.0000 mg | Freq: Once | INTRAMUSCULAR | Status: AC

## 2019-10-26 MED ORDER — DIPHENHYDRAMINE HCL 12.5 MG/5ML PO ELIX: 12.5000 mg | ORAL_SOLUTION | ORAL | Status: DC | PRN

## 2019-10-26 MED ORDER — CELECOXIB 200 MG PO CAPS
ORAL_CAPSULE | ORAL | Status: AC
  Administered 2019-10-26: 400 mg via ORAL
  Filled 2019-10-26: qty 2

## 2019-10-26 MED ORDER — PROPOFOL 10 MG/ML IV BOLUS
INTRAVENOUS | Status: DC | PRN
  Administered 2019-10-26 (×3): 20 mg via INTRAVENOUS

## 2019-10-26 MED ORDER — ACETAMINOPHEN 10 MG/ML IV SOLN
1000.0000 mg | Freq: Four times a day (QID) | INTRAVENOUS | Status: AC
  Administered 2019-10-26 – 2019-10-27 (×4): 1000 mg via INTRAVENOUS
  Filled 2019-10-26 (×4): qty 100

## 2019-10-26 MED ORDER — RENA-VITE PO TABS
1.0000 | ORAL_TABLET | Freq: Every day | ORAL | Status: DC
  Administered 2019-10-26 – 2019-10-28 (×3): 1 via ORAL
  Filled 2019-10-26 (×3): qty 1

## 2019-10-26 MED ORDER — POLYMYXIN B-TRIMETHOPRIM 10000-0.1 UNIT/ML-% OP SOLN
1.0000 [drp] | OPHTHALMIC | Status: DC
  Administered 2019-10-27 – 2019-10-28 (×8): 1 [drp] via OPHTHALMIC
  Filled 2019-10-26: qty 10

## 2019-10-26 MED ORDER — ENOXAPARIN SODIUM 40 MG/0.4ML ~~LOC~~ SOLN
40.0000 mg | SUBCUTANEOUS | Status: DC
  Administered 2019-10-27 – 2019-10-28 (×2): 40 mg via SUBCUTANEOUS
  Filled 2019-10-26 (×2): qty 0.4

## 2019-10-26 MED ORDER — SENNOSIDES-DOCUSATE SODIUM 8.6-50 MG PO TABS
1.0000 | ORAL_TABLET | Freq: Two times a day (BID) | ORAL | Status: DC
  Administered 2019-10-26 – 2019-10-27 (×3): 1 via ORAL
  Filled 2019-10-26 (×4): qty 1

## 2019-10-26 MED ORDER — ONDANSETRON HCL 4 MG PO TABS: 4.0000 mg | ORAL_TABLET | Freq: Four times a day (QID) | ORAL | Status: DC | PRN

## 2019-10-26 MED ORDER — METOCLOPRAMIDE HCL 10 MG PO TABS: 5.0000 mg | ORAL_TABLET | Freq: Three times a day (TID) | ORAL | Status: DC | PRN

## 2019-10-26 MED ORDER — PROPOFOL 500 MG/50ML IV EMUL
INTRAVENOUS | Status: AC
  Filled 2019-10-26: qty 50

## 2019-10-26 MED ORDER — LACTATED RINGERS IV SOLN
INTRAVENOUS | Status: DC
  Administered 2019-10-26: 1000 mL via INTRAVENOUS

## 2019-10-26 MED ORDER — CALCIUM CARBONATE ANTACID 500 MG PO CHEW
1.0000 | CHEWABLE_TABLET | Freq: Every day | ORAL | Status: DC
  Administered 2019-10-27 – 2019-10-28 (×2): 200 mg via ORAL
  Filled 2019-10-26 (×2): qty 1

## 2019-10-26 MED ORDER — LIDOCAINE HCL (PF) 2 % IJ SOLN
INTRAMUSCULAR | Status: AC
  Filled 2019-10-26: qty 5

## 2019-10-26 MED ORDER — LIDOCAINE HCL (CARDIAC) PF 100 MG/5ML IV SOSY
PREFILLED_SYRINGE | INTRAVENOUS | Status: DC | PRN
  Administered 2019-10-26: 60 mg via INTRAVENOUS

## 2019-10-26 MED ORDER — CELECOXIB 200 MG PO CAPS: 400.0000 mg | ORAL_CAPSULE | Freq: Once | ORAL | Status: AC

## 2019-10-26 MED ORDER — KETAMINE HCL 10 MG/ML IJ SOLN
INTRAMUSCULAR | Status: DC | PRN
  Administered 2019-10-26: 10 mg via INTRAVENOUS
  Administered 2019-10-26 (×2): 20 mg via INTRAVENOUS
  Administered 2019-10-26: 10 mg via INTRAVENOUS

## 2019-10-26 MED ORDER — BSS IO SOLN
15.0000 mL | Freq: Once | INTRAOCULAR | Status: DC
  Filled 2019-10-26: qty 15

## 2019-10-26 MED ORDER — KETOROLAC TROMETHAMINE 0.5 % OP SOLN
1.0000 [drp] | Freq: Three times a day (TID) | OPHTHALMIC | Status: DC | PRN
  Administered 2019-10-26: 1 [drp] via OPHTHALMIC
  Filled 2019-10-26: qty 3

## 2019-10-26 MED ORDER — PROPOFOL 500 MG/50ML IV EMUL
INTRAVENOUS | Status: DC | PRN
  Administered 2019-10-26: 50 ug/kg/min via INTRAVENOUS

## 2019-10-26 MED ORDER — FAMOTIDINE 20 MG PO TABS
ORAL_TABLET | ORAL | Status: AC
  Administered 2019-10-26: 20 mg via ORAL
  Filled 2019-10-26: qty 1

## 2019-10-26 MED ORDER — EPHEDRINE SULFATE 50 MG/ML IJ SOLN
INTRAMUSCULAR | Status: DC | PRN
  Administered 2019-10-26: 5 mg via INTRAVENOUS
  Administered 2019-10-26: 10 mg via INTRAVENOUS

## 2019-10-26 MED ORDER — ASCORBIC ACID 500 MG PO TABS
1000.0000 mg | ORAL_TABLET | Freq: Every day | ORAL | Status: DC
  Administered 2019-10-26 – 2019-10-28 (×3): 1000 mg via ORAL
  Filled 2019-10-26 (×3): qty 2

## 2019-10-26 MED ORDER — CEFAZOLIN SODIUM-DEXTROSE 2-4 GM/100ML-% IV SOLN
2.0000 g | Freq: Four times a day (QID) | INTRAVENOUS | Status: AC
  Administered 2019-10-26 – 2019-10-27 (×4): 2 g via INTRAVENOUS
  Filled 2019-10-26 (×4): qty 100

## 2019-10-26 MED ORDER — SODIUM CHLORIDE 0.9 % IV SOLN
INTRAVENOUS | Status: DC | PRN
  Administered 2019-10-26: 10 ug/min via INTRAVENOUS

## 2019-10-26 MED ORDER — ONDANSETRON HCL 4 MG/2ML IJ SOLN: 4.0000 mg | Freq: Once | INTRAMUSCULAR | Status: DC | PRN

## 2019-10-26 MED ORDER — METOCLOPRAMIDE HCL 10 MG PO TABS
10.0000 mg | ORAL_TABLET | Freq: Three times a day (TID) | ORAL | Status: DC
  Administered 2019-10-26 – 2019-10-28 (×6): 10 mg via ORAL
  Filled 2019-10-26 (×6): qty 1

## 2019-10-26 MED ORDER — ALUM & MAG HYDROXIDE-SIMETH 200-200-20 MG/5ML PO SUSP: 30.0000 mL | ORAL | Status: DC | PRN

## 2019-10-26 MED ORDER — FERROUS SULFATE 325 (65 FE) MG PO TABS
325.0000 mg | ORAL_TABLET | Freq: Two times a day (BID) | ORAL | Status: DC
  Administered 2019-10-27 – 2019-10-28 (×3): 325 mg via ORAL
  Filled 2019-10-26 (×3): qty 1

## 2019-10-26 MED ORDER — HYDROMORPHONE HCL 1 MG/ML IJ SOLN: 0.5000 mg | INTRAMUSCULAR | Status: DC | PRN

## 2019-10-26 MED ORDER — CHLORHEXIDINE GLUCONATE 4 % EX LIQD
60.0000 mL | Freq: Once | CUTANEOUS | Status: AC
  Administered 2019-10-26: 4 via TOPICAL

## 2019-10-26 MED ORDER — GABAPENTIN 300 MG PO CAPS
300.0000 mg | ORAL_CAPSULE | Freq: Every day | ORAL | Status: DC
  Administered 2019-10-26 – 2019-10-27 (×2): 300 mg via ORAL
  Filled 2019-10-26 (×2): qty 1

## 2019-10-26 MED ORDER — MIDAZOLAM HCL 2 MG/2ML IJ SOLN
INTRAMUSCULAR | Status: AC
  Filled 2019-10-26: qty 2

## 2019-10-26 MED ORDER — ONDANSETRON HCL 4 MG/2ML IJ SOLN: 4.0000 mg | Freq: Four times a day (QID) | INTRAMUSCULAR | Status: DC | PRN

## 2019-10-26 MED ORDER — CEFAZOLIN SODIUM-DEXTROSE 2-4 GM/100ML-% IV SOLN
INTRAVENOUS | Status: AC
  Filled 2019-10-26: qty 100

## 2019-10-26 MED ORDER — CALCIUM-MAGNESIUM 500-250 MG PO TABS: 1.0000 | ORAL_TABLET | Freq: Every day | ORAL | Status: DC

## 2019-10-26 MED ORDER — ACETAMINOPHEN 325 MG PO TABS: 325.0000 mg | ORAL_TABLET | Freq: Four times a day (QID) | ORAL | Status: DC | PRN

## 2019-10-26 MED ORDER — VITAMIN D 25 MCG (1000 UNIT) PO TABS
2000.0000 [IU] | ORAL_TABLET | Freq: Every day | ORAL | Status: DC
  Administered 2019-10-26 – 2019-10-28 (×3): 2000 [IU] via ORAL
  Filled 2019-10-26 (×3): qty 2

## 2019-10-26 MED ORDER — GABAPENTIN 300 MG PO CAPS: 300.0000 mg | ORAL_CAPSULE | Freq: Once | ORAL | Status: AC

## 2019-10-26 MED ORDER — DEXMEDETOMIDINE HCL 200 MCG/2ML IV SOLN
INTRAVENOUS | Status: DC | PRN
  Administered 2019-10-26: 12 ug via INTRAVENOUS

## 2019-10-26 MED ORDER — DEXAMETHASONE SODIUM PHOSPHATE 10 MG/ML IJ SOLN
INTRAMUSCULAR | Status: AC
  Administered 2019-10-26: 8 mg via INTRAVENOUS
  Filled 2019-10-26: qty 1

## 2019-10-26 MED ORDER — PROPOFOL 10 MG/ML IV BOLUS
INTRAVENOUS | Status: AC
  Filled 2019-10-26: qty 20

## 2019-10-26 MED ORDER — BISACODYL 10 MG RE SUPP
10.0000 mg | Freq: Every day | RECTAL | Status: DC | PRN
  Administered 2019-10-28: 10 mg via RECTAL
  Filled 2019-10-26: qty 1

## 2019-10-26 MED ORDER — BSS IO SOLN: 15.0000 mL | Freq: Once | INTRAOCULAR | Status: DC

## 2019-10-26 MED ORDER — ACETAMINOPHEN 10 MG/ML IV SOLN
INTRAVENOUS | Status: DC | PRN
  Administered 2019-10-26: 1000 mg via INTRAVENOUS

## 2019-10-26 MED ORDER — MAGNESIUM HYDROXIDE 400 MG/5ML PO SUSP
30.0000 mL | Freq: Every day | ORAL | Status: DC
  Administered 2019-10-27: 30 mL via ORAL
  Filled 2019-10-26 (×2): qty 30

## 2019-10-26 MED ORDER — FAMOTIDINE 20 MG PO TABS: 20.0000 mg | ORAL_TABLET | Freq: Once | ORAL | Status: AC

## 2019-10-26 MED ORDER — POLYMYXIN B-TRIMETHOPRIM 10000-0.1 UNIT/ML-% OP SOLN
1.0000 [drp] | Freq: Three times a day (TID) | OPHTHALMIC | Status: DC
  Filled 2019-10-26: qty 10

## 2019-10-26 MED ORDER — MIDAZOLAM HCL 5 MG/5ML IJ SOLN
INTRAMUSCULAR | Status: DC | PRN
  Administered 2019-10-26: 2 mg via INTRAVENOUS

## 2019-10-26 MED ORDER — FLEET ENEMA 7-19 GM/118ML RE ENEM: 1.0000 | ENEMA | Freq: Once | RECTAL | Status: DC | PRN

## 2019-10-26 MED ORDER — SODIUM CHLORIDE 0.9 % IV SOLN: INTRAVENOUS | Status: DC

## 2019-10-26 MED ORDER — TRAMADOL HCL 50 MG PO TABS: 50.0000 mg | ORAL_TABLET | ORAL | Status: DC | PRN

## 2019-10-26 SURGICAL SUPPLY — 60 items
BLADE DRUM FLTD (BLADE) ×3 IMPLANT
BLADE SAW 90X25X1.19 OSCILLAT (BLADE) ×3 IMPLANT
CANISTER SUCT 1200ML W/VALVE (MISCELLANEOUS) ×3 IMPLANT
CANISTER SUCT 3000ML PPV (MISCELLANEOUS) ×6 IMPLANT
CARTRIDGE OIL MAESTRO DRILL (MISCELLANEOUS) ×1 IMPLANT
COVER WAND RF STERILE (DRAPES) ×3 IMPLANT
CUP ACETBLR 48 OD 100 SERIES (Hips) ×2 IMPLANT
DIFFUSER DRILL AIR PNEUMATIC (MISCELLANEOUS) ×3 IMPLANT
DRAPE 3/4 80X56 (DRAPES) ×3 IMPLANT
DRAPE INCISE IOBAN 66X60 STRL (DRAPES) ×3 IMPLANT
DRSG DERMACEA 8X12 NADH (GAUZE/BANDAGES/DRESSINGS) ×3 IMPLANT
DRSG OPSITE POSTOP 4X12 (GAUZE/BANDAGES/DRESSINGS) ×1 IMPLANT
DRSG OPSITE POSTOP 4X14 (GAUZE/BANDAGES/DRESSINGS) IMPLANT
DRSG TEGADERM 4X4.75 (GAUZE/BANDAGES/DRESSINGS) ×3 IMPLANT
DURAPREP 26ML APPLICATOR (WOUND CARE) ×3 IMPLANT
ELECT REM PT RETURN 9FT ADLT (ELECTROSURGICAL) ×3
ELECTRODE REM PT RTRN 9FT ADLT (ELECTROSURGICAL) ×1 IMPLANT
GLOVE BIO SURGEON STRL SZ7.5 (GLOVE) ×6 IMPLANT
GLOVE BIOGEL M STRL SZ7.5 (GLOVE) ×6 IMPLANT
GLOVE BIOGEL PI IND STRL 7.5 (GLOVE) ×1 IMPLANT
GLOVE BIOGEL PI INDICATOR 7.5 (GLOVE) ×2
GLOVE INDICATOR 8.0 STRL GRN (GLOVE) ×3 IMPLANT
GOWN STRL REUS W/ TWL LRG LVL3 (GOWN DISPOSABLE) ×2 IMPLANT
GOWN STRL REUS W/ TWL XL LVL3 (GOWN DISPOSABLE) ×1 IMPLANT
GOWN STRL REUS W/TWL LRG LVL3 (GOWN DISPOSABLE) ×4
GOWN STRL REUS W/TWL XL LVL3 (GOWN DISPOSABLE) ×2
HEAD FEM STD 32X+5 STRL (Hips) ×2 IMPLANT
HEMOVAC 400CC 10FR (MISCELLANEOUS) ×3 IMPLANT
HOLDER FOLEY CATH W/STRAP (MISCELLANEOUS) ×3 IMPLANT
HOOD PEEL AWAY FLYTE STAYCOOL (MISCELLANEOUS) ×6 IMPLANT
KIT TURNOVER KIT A (KITS) ×3 IMPLANT
LINER MAR 4 10 32X48 (Hips) ×2 IMPLANT
LINER MARATHON 4 10 32X48 (Hips) IMPLANT
MANIFOLD NEPTUNE II (INSTRUMENTS) ×3 IMPLANT
NDL SAFETY ECLIPSE 18X1.5 (NEEDLE) ×1 IMPLANT
NEEDLE HYPO 18GX1.5 SHARP (NEEDLE) ×2
NS IRRIG 500ML POUR BTL (IV SOLUTION) ×3 IMPLANT
OIL CARTRIDGE MAESTRO DRILL (MISCELLANEOUS) ×3
PACK HIP PROSTHESIS (MISCELLANEOUS) ×3 IMPLANT
PENCIL SMOKE ULTRAEVAC 22 CON (MISCELLANEOUS) ×3 IMPLANT
PIN STEIN THRED 5/32 (Pin) ×3 IMPLANT
PULSAVAC PLUS IRRIG FAN TIP (DISPOSABLE) ×3
SOL .9 NS 3000ML IRR  AL (IV SOLUTION) ×2
SOL .9 NS 3000ML IRR UROMATIC (IV SOLUTION) ×1 IMPLANT
SOL PREP PVP 2OZ (MISCELLANEOUS) ×3
SOLUTION PREP PVP 2OZ (MISCELLANEOUS) ×1 IMPLANT
SPONGE DRAIN TRACH 4X4 STRL 2S (GAUZE/BANDAGES/DRESSINGS) ×3 IMPLANT
STAPLER SKIN PROX 35W (STAPLE) ×3 IMPLANT
STEM FEM CMNTLSS SM AML 13.5 (Hips) ×2 IMPLANT
SUT ETHIBOND #5 BRAIDED 30INL (SUTURE) ×3 IMPLANT
SUT VIC AB 0 CT1 36 (SUTURE) ×3 IMPLANT
SUT VIC AB 1 CT1 36 (SUTURE) ×6 IMPLANT
SUT VIC AB 2-0 CT1 27 (SUTURE) ×2
SUT VIC AB 2-0 CT1 TAPERPNT 27 (SUTURE) ×1 IMPLANT
SYR 20ML LL LF (SYRINGE) ×3 IMPLANT
TAPE CLOTH 3X10 WHT NS LF (GAUZE/BANDAGES/DRESSINGS) ×3 IMPLANT
TAPE TRANSPORE STRL 2 31045 (GAUZE/BANDAGES/DRESSINGS) ×3 IMPLANT
TIP FAN IRRIG PULSAVAC PLUS (DISPOSABLE) ×1 IMPLANT
TOWEL OR 17X26 4PK STRL BLUE (TOWEL DISPOSABLE) ×3 IMPLANT
TRAY FOLEY MTR SLVR 16FR STAT (SET/KITS/TRAYS/PACK) ×3 IMPLANT

## 2019-10-26 NOTE — Transfer of Care (Signed)
Immediate Anesthesia Transfer of Care Note  Patient: HELOISE GORDAN  Procedure(s) Performed: TOTAL HIP ARTHROPLASTY (Right Hip)  Patient Location: PACU  Anesthesia Type:General  Level of Consciousness: drowsy  Airway & Oxygen Therapy: Patient Spontanous Breathing and Patient connected to face mask oxygen  Post-op Assessment: Report given to RN and Post -op Vital signs reviewed and stable  Post vital signs: Reviewed and stable  Last Vitals:  Vitals Value Taken Time  BP 101/73 10/26/19 1708  Temp    Pulse 73 10/26/19 1711  Resp 24 10/26/19 1711  SpO2 99 % 10/26/19 1711  Vitals shown include unvalidated device data.  Last Pain:  Vitals:   10/26/19 1147  TempSrc: Tympanic  PainSc: 8       Patients Stated Pain Goal: 0 (10/26/19 1147)  Complications: No apparent anesthesia complications

## 2019-10-26 NOTE — Anesthesia Procedure Notes (Addendum)
Spinal  Patient location during procedure: OR Start time: 10/26/2019 1:26 PM End time: 10/26/2019 1:31 PM Staffing Performed: resident/CRNA  Anesthesiologist: Alver Fisher, MD Resident/CRNA: Lynden Oxford, CRNA Preanesthetic Checklist Completed: patient identified, IV checked, site marked, risks and benefits discussed, surgical consent, monitors and equipment checked, pre-op evaluation and timeout performed Spinal Block Patient position: sitting Prep: ChloraPrep Patient monitoring: heart rate, continuous pulse ox and blood pressure Approach: midline Location: L3-4 Injection technique: single-shot Needle Needle type: Introducer and Pencil-Tip  Needle gauge: 24 G Needle length: 9 cm Assessment Sensory level: T4

## 2019-10-26 NOTE — H&P (Signed)
The patient has been re-examined, and the chart reviewed, and there have been no interval changes to the documented history and physical.    The risks, benefits, and alternatives have been discussed at length. The patient expressed understanding of the risks benefits and agreed with plans for surgical intervention.  Shizuko Wojdyla P. Amayrany Cafaro, Jr. M.D.    

## 2019-10-26 NOTE — Op Note (Signed)
OPERATIVE NOTE  DATE OF SURGERY:  10/26/2019  PATIENT NAME:  Alicia Calderon   DOB: 1956/04/23  MRN: [id number]  PRE-OPERATIVE DIAGNOSIS: Degenerative arthrosis of the right hip, primary  POST-OPERATIVE DIAGNOSIS:  Same  PROCEDURE:  Right total hip arthroplasty  SURGEON:  Jena Gauss. M.D.  ASSISTANT: Baldwin Jamaica, PA-C (present and scrubbed throughout the case, critical for assistance with exposure, retraction, instrumentation, and closure)  ANESTHESIA: spinal  ESTIMATED BLOOD LOSS: 100 mL  FLUIDS REPLACED: 800 mL of crystalloid  DRAINS: 2 medium drains to a Hemovac reservoir  IMPLANTS UTILIZED: DePuy 13.5 mm small stature AML femoral stem, 48 mm OD Pinnacle 100 acetabular component, +4 mm 10 degree Pinnacle Marathon polyethylene insert, and a 32 mm CoCr +5 mm hip ball  INDICATIONS FOR SURGERY: Alicia Calderon is a 64 y.o. year old female with a long history of progressive hip and groin  pain. X-rays demonstrated severe degenerative changes. The patient had not seen any significant improvement despite conservative nonsurgical intervention. After discussion of the risks and benefits of surgical intervention, the patient expressed understanding of the risks benefits and agree with plans for total hip arthroplasty.   The risks, benefits, and alternatives were discussed at length including but not limited to the risks of infection, bleeding, nerve injury, stiffness, blood clots, the need for revision surgery, limb length inequality, dislocation, cardiopulmonary complications, among others, and they were willing to proceed.  PROCEDURE IN DETAIL: The patient was brought into the operating room and, after adequate spinal anesthesia was achieved, the patient was placed in a left lateral decubitus position. Axillary roll was placed and all bony prominences were well-padded. The patient's right hip was cleaned and prepped with alcohol and DuraPrep and draped in the usual sterile fashion. A  "timeout" was performed as per usual protocol. A lateral curvilinear incision was made gently curving towards the posterior superior iliac spine. The IT band was incised in line with the skin incision and the fibers of the gluteus maximus were split in line. The piriformis tendon was identified, skeletonized, and incised at its insertion to the proximal femur and reflected posteriorly. A T type posterior capsulotomy was performed. Prior to dislocation of the femoral head, a threaded Steinmann pin was inserted through a separate stab incision into the pelvis superior to the acetabulum and bent in the form of a stylus so as to assess limb length and hip offset throughout the procedure. The femoral head was then dislocated posteriorly. Inspection of the femoral head demonstrated severe degenerative changes with full-thickness loss of articular cartilage. The femoral neck cut was performed using an oscillating saw. The anterior capsule was elevated off of the femoral neck using a periosteal elevator. Attention was then directed to the acetabulum. The remnant of the labrum was excised using electrocautery. Inspection of the acetabulum also demonstrated significant degenerative changes. The acetabulum was reamed in sequential fashion up to a 47 mm diameter. Good punctate bleeding bone was encountered. A 48 mm Pinnacle 100 acetabular component was positioned and impacted into place. Good scratch fit was appreciated. A +4 mm neutral polyethylene trial was inserted.  Attention was then directed to the proximal femur. A hole for reaming of the proximal femoral canal was created using a high-speed burr. The femoral canal was reamed in sequential fashion up to a 13 mm diameter. This allowed for approximately 5 cm of scratch fit. Serial broaches were inserted up to a 13.5 mm small stature femoral broach. Calcar region was planed and a  trial reduction was performed using a 32 mm hip ball with a +5 mm neck length.  Reasonably  good stability was noted but it was elected to trial with a +4 mm 10 degree trial with the high side at the 8 o'clock position.  Good equalization of limb lengths and hip offset was appreciated and excellent stability was noted both anteriorly and posteriorly. Trial components were removed. The acetabular shell was irrigated with copious amounts of normal saline with antibiotic solution and suctioned dry. A +4 mm 10 degree Pinnacle Marathon polyethylene insert was positioned with the high side at the 8 o'clock position and impacted into place. Next, a 13.5 mm small stature AML femoral stem was positioned and impacted into place. Excellent scratch fit was appreciated. A trial reduction was again performed with a 32 mm hip ball with a +5 mm neck length. Again, good equalization of limb lengths was appreciated and excellent stability appreciated both anteriorly and posteriorly. The hip was then dislocated and the trial hip ball was removed. The Morse taper was cleaned and dried. A 32 mm cobalt chrome hip ball with a +5 mm neck length was placed on the trunnion and impacted into place. The hip was then reduced and placed through range of motion. Excellent stability was appreciated both anteriorly and posteriorly.  The wound was irrigated with copious amounts of normal saline with antibiotic solution and suctioned dry. Good hemostasis was appreciated. The posterior capsulotomy was repaired using #5 Ethibond. Piriformis tendon was reapproximated to the undersurface of the gluteus medius tendon using #5 Ethibond. Two medium drains were placed in the wound bed and brought out through separate stab incisions to be attached to a Hemovac reservoir. The IT band was reapproximated using interrupted sutures of #1 Vicryl. Subcutaneous tissue was approximated using first #0 Vicryl followed by #2-0 Vicryl. The skin was closed with skin staples.  The patient tolerated the procedure well and was transported to the recovery room in  stable condition.   Marciano Sequin., M.D.

## 2019-10-26 NOTE — Anesthesia Preprocedure Evaluation (Signed)
Anesthesia Evaluation  Patient identified by MRN, date of birth, ID band Patient awake    Reviewed: Allergy & Precautions, NPO status , Patient's Chart, lab work & pertinent test results  History of Anesthesia Complications Negative for: history of anesthetic complications  Airway Mallampati: II  TM Distance: >3 FB Neck ROM: Full    Dental no notable dental hx.    Pulmonary neg pulmonary ROS, neg sleep apnea, neg COPD,    breath sounds clear to auscultation- rhonchi (-) wheezing      Cardiovascular Exercise Tolerance: Good (-) hypertension(-) CAD, (-) Past MI, (-) Cardiac Stents and (-) CABG  Rhythm:Regular Rate:Normal - Systolic murmurs and - Diastolic murmurs    Neuro/Psych neg Seizures negative neurological ROS  negative psych ROS   GI/Hepatic negative GI ROS, Neg liver ROS,   Endo/Other  negative endocrine ROSneg diabetes  Renal/GU negative Renal ROS     Musculoskeletal  (+) Arthritis ,   Abdominal (+) - obese,   Peds  Hematology negative hematology ROS (+)   Anesthesia Other Findings Past Medical History: No date: Arthritis No date: Colon polyp No date: Medical history non-contributory No date: Osteopenia   Reproductive/Obstetrics                             Lab Results  Component Value Date   WBC 8.7 10/21/2019   HGB 12.4 10/21/2019   HCT 38.2 10/21/2019   MCV 89.5 10/21/2019   PLT 337 10/21/2019    Anesthesia Physical Anesthesia Plan  ASA: II  Anesthesia Plan: Spinal   Post-op Pain Management:    Induction:   PONV Risk Score and Plan: 2 and Propofol infusion  Airway Management Planned: Natural Airway  Additional Equipment:   Intra-op Plan:   Post-operative Plan:   Informed Consent: I have reviewed the patients History and Physical, chart, labs and discussed the procedure including the risks, benefits and alternatives for the proposed anesthesia with the  patient or authorized representative who has indicated his/her understanding and acceptance.     Dental advisory given  Plan Discussed with: CRNA and Anesthesiologist  Anesthesia Plan Comments:         Anesthesia Quick Evaluation

## 2019-10-27 MED ORDER — SODIUM CHLORIDE 0.9 % IV BOLUS
500.0000 mL | Freq: Once | INTRAVENOUS | Status: AC
  Administered 2019-10-27: 500 mL via INTRAVENOUS

## 2019-10-27 NOTE — TOC Progression Note (Signed)
Transition of Care [facility]) - Progression Note    Patient Details  Name: Alicia Calderon MRN: [id number] Date of Birth: 1956-05-10  [facility] of [facility]) CM/SW Contact  Barrie Dunker, RN Phone Number: 10/27/2019, 9:09 AM  Clinical Narrative:   Requested the price of Lovenox will notify once obtained         Expected Discharge Plan and Services                                                 Social Determinants of Health (SDOH) Interventions    Readmission Risk Interventions No flowsheet data found.

## 2019-10-27 NOTE — Progress Notes (Signed)
ORTHOPAEDICS PROGRESS NOTE  PATIENT NAME: Alicia Calderon DOB: 05/03/1956  MRN: [id number]  POD # 1: Right total hip arthroplasty  Subjective: The patient rested well last night.  Pain is been under good control. She did sit at the bedside last night but became slightly dizzy.  No nausea or vomiting.  Objective: Vital signs in last 24 hours: Temp:  [97.6 F (36.4 C)-98.8 F (37.1 C)] 97.6 F (36.4 C) (02/22 2338) Pulse Rate:  [66-81] 72 (02/23 0744) Resp:  [13-27] 17 (02/23 0744) BP: (101-140)/(68-92) 116/68 (02/23 0744) SpO2:  [97 %-100 %] 99 % (02/23 0744) Weight:  [65.3 kg] 65.3 kg (02/22 1147)  Intake/Output from previous day: 02/22 0701 - 02/23 0700 In: 2429.3 [P.O.:480; I.V.:1549.3; IV Piggyback:400] Out: 1390 [Urine:1100; Drains:190; Blood:100]  No results for input(s): WBC, HGB, HCT, PLT, K, CL, CO2, BUN, CREATININE, GLUCOSE, CALCIUM, LABPT, INR in the last 72 hours.  EXAM General: Well-developed well-nourished female seen in no apparent discomfort. Lungs: clear to auscultation Cardiac: normal rate and regular rhythm Abdomen: Soft, nontender, nondistended.  Bowel sounds are present. Right lower extremity: Hip dressing is dry and intact.  No significant swelling or ecchymosis to the hip or thigh.  Hemovac drain is in place and functioning.  Homans test is negative. Neurologic: Awake, alert, oriented.  Sensory and motor function are intact.  Assessment: Right total hip arthroplasty  Secondary diagnoses: Osteopenia Status post left total hip arthroplasty  Plan: Today's goals were reviewed with the patient.  Begin physical therapy and Occupational Therapy as per total hip arthroplasty rehab protocol.  Posterior hip precautions are to be continued.  The patient may be weightbearing as tolerated to the right lower extremity. Plan is to go Home after hospital stay. DVT Prophylaxis - Lovenox, Foot Pumps and TED hose  Demarius Archila P. Angie Fava M.D.

## 2019-10-27 NOTE — Anesthesia Postprocedure Evaluation (Signed)
Anesthesia Post Note  Patient: Alicia Calderon  Procedure(s) Performed: TOTAL HIP ARTHROPLASTY (Right Hip)  Patient location during evaluation: Nursing Unit Anesthesia Type: Combined General/Spinal Level of consciousness: awake and alert Pain management: pain level controlled Vital Signs Assessment: post-procedure vital signs reviewed and stable Respiratory status: spontaneous breathing, nonlabored ventilation and respiratory function stable Cardiovascular status: blood pressure returned to baseline and stable Postop Assessment: no headache, no backache, no apparent nausea or vomiting, adequate PO intake and able to ambulate Anesthetic complications: no     Last Vitals:  Vitals:   10/26/19 2338 10/27/19 0744  BP: 107/69 116/68  Pulse: 70 72  Resp: 18 17  Temp: 36.4 C   SpO2: 98% 99%    Last Pain:  Vitals:   10/27/19 0143  TempSrc:   PainSc: 6                  Lynden Oxford

## 2019-10-27 NOTE — TOC Benefit Eligibility Note (Signed)
Transition of Care [facility]) Benefit Eligibility Note    Patient Details  Name: Alicia Calderon MRN: [id number] Date of Birth: Sep 03, 1956   Medication/Dose: Enoxaparin 73m once daily for 14 days  Covered?: Yes  Tier: Other(Tier 1)  Prescription Coverage Preferred Pharmacy: [facility] Person/Company/Phone Number:: [email or web address] at [phone] Co-Pay: $17.59 estimated copay for 30 day supply  Prior Approval: No  Deductible: Unmet($200 deductible, $26.56 met.  $173.44 remaining as of time of call.)   HDannette BarbaraPhone Number: [phone] [phone]/23/2021, 10:05 AM

## 2019-10-27 NOTE — TOC Initial Note (Signed)
Transition of Care [facility]) - Initial/Assessment Note    Patient Details  Name: Alicia Calderon MRN: [id number] Date of Birth: 1956-02-02  Transition of Care [facility]) CM/SW Contact:    Barrie Dunker, RN Phone Number: 10/27/2019, 3:53 PM  Clinical Narrative:       The patient lives alone but will be going to her mothers at [place] for recovery, I provided Rosey Bath with [facility] the address to the patient's mothers as well as the patient's cell phone number  , She has a RW at home and she does not need additional DME, she has the other side done in Sept.  I provided the price of the lovenox at $17.59 , she stated understanding         Expected Discharge Plan: Home w Home Health Services Barriers to Discharge: Continued Medical Work up   Patient Goals and CMS Choice Patient states their goals for this hospitalization and ongoing recovery are:: go to her moms      Expected Discharge Plan and Services Expected Discharge Plan: Home w Home Health Services   Discharge Planning Services: CM Consult   Living arrangements for the past 2 months: Single Family Home(going to her mothers at [address] [place])                 DME Arranged: N/A         HH Arranged: PT HH Agency: [facility] [employer] Date HH Agency Contacted: 10/27/19 Time HH Agency Contacted: 1552 Representative spoke with at [facility] Agency: Rosey Bath  Prior Living Arrangements/Services Living arrangements for the past 2 months: Single Family Home(going to her mothers at [address] [place]) Lives with:: Self Patient language and need for interpreter reviewed:: Yes Do you feel safe going back to the place where you live?: Yes      Need for Family Participation in Patient Care: No (Comment) Care giver support system in place?: Yes (comment) Current home services: DME(RW) Criminal Activity/Legal Involvement Pertinent to Current Situation/Hospitalization: No - Comment as  needed  Activities of Daily Living Home Assistive Devices/Equipment: Eyeglasses ADL Screening (condition at time of admission) Patient's cognitive ability adequate to safely complete daily activities?: Yes Is the patient deaf or have difficulty hearing?: No Does the patient have difficulty seeing, even when wearing glasses/contacts?: No Does the patient have difficulty concentrating, remembering, or making decisions?: No Patient able to express need for assistance with ADLs?: Yes Does the patient have difficulty dressing or bathing?: No Independently performs ADLs?: Yes (appropriate for developmental age) Does the patient have difficulty walking or climbing stairs?: Yes Weakness of Legs: Right Weakness of Arms/Hands: None  Permission Sought/Granted   Permission granted to share information with : Yes, Verbal Permission Granted              Emotional Assessment Appearance:: Appears stated age Attitude/Demeanor/Rapport: Engaged Affect (typically observed): Appropriate Orientation: : Oriented to Self, Oriented to Situation, Oriented to Place, Oriented to  Time Alcohol / Substance Use: Not Applicable Psych Involvement: No (comment)  Admission diagnosis:  Hx of total hip arthroplasty, right [Q76.195] Patient Active Problem List   Diagnosis Date Noted  . Hx of total hip arthroplasty, right 10/26/2019  . Status post total hip replacement, left 05/07/2019  . Primary osteoarthritis of right hip 02/23/2019  . Osteopenia 09/05/2018   PCP:  Richardean Chimera, MD Pharmacy:   [facility] [address], [place] - [address] [place] [address] Phone: [phone] Fax:  [phone]     Social Determinants of Health (SDOH) Interventions    Readmission Risk Interventions No flowsheet data found.

## 2019-10-27 NOTE — Plan of Care (Signed)

## 2019-10-27 NOTE — Progress Notes (Signed)
Physical Therapy Treatment Patient Details Name: Alicia Calderon MRN: [id number] DOB: 08-05-1956 Today's Date: 10/27/2019    History of Present Illness 64 y.o.female s/prighttotal hip arthroplasty 10/26/19 (previously left total hip arthroplasty on 05/06/2019) Patient states the left hip is doing very well, no complaints.Previously seen by podiatry for cellulitis and abscess of the left great toe. Patient states she has completed 2 courses ofantibiotics andthe toe is much improved.    PT Comments    Waited to see pt until after bolus this afternoon, pt very eager to see what she could actually do.  Ultimately BP (and all vitals) were appropriate t/o the session and though she was sore and initially hesitant to put a lot of weight on the R LE she was motivated to push through and with slow but steadily improving cadence and heavy use of walker (again gradually diminishing need) she was able to circumambulate the nurses station with chair following but never needing/wanting to sit.  She was very subjectively fatigued after the effort and requested not to do exercises, however she did agree to a few as she knew it was good and necessary for her.  Ultimately pt was very pleased that she did not have BP issues and was able to do as well as she could.  Pt reports increased pain relative to this AM and requests pain meds.    Follow Up Recommendations  Home health PT     Equipment Recommendations  None recommended by PT    Recommendations for Other Services       Precautions / Restrictions Precautions Precautions: Posterior Hip Precaution Booklet Issued: Yes (comment) Precaution Comments: pt recalls 3/3/ precautions from contralateral side in Sept Restrictions RLE Weight Bearing: Weight bearing as tolerated    Mobility  Bed Mobility Overal bed mobility: Needs Assistance Bed Mobility: Sit to Supine       Sit to supine: Min assist   General bed mobility comments: Pt needed light  assist to get R LE back up into bed, able to scoot in bed with trapeze and VCs only  Transfers Overall transfer level: Needs assistance Equipment used: Rolling walker (2 wheeled) Transfers: Sit to/from Stand Sit to Stand: Min guard         General transfer comment: Pt needed VCs, extra time and reinforcement to transition to/from standing appropriately but did not need direct phyiscal assist to successfully perform  Ambulation/Gait Ambulation/Gait assistance: Min guard Gait Distance (Feet): 200 Feet Assistive device: Rolling walker (2 wheeled)       General Gait Details: Pt determined to do as much as she could after issues with standing this AM.  She started out very slow and labored, hesitant to put much weight through R LE.  However with great effort she was able to slow increase cadence/speed and WBing.  Pt's O2 remained in the high 90s t/o the effort, HR breached 100 bpm but was not an issue.  She was fatigued with the effort but by the end was able to maintain slow and consistent walker momentum and much improved tolerance with R stance phase.  She showed great effort/motivation and ultimately did much better than she or this PT expected.     Stairs             Wheelchair Mobility    Modified Rankin (Stroke Patients Only)       Balance Overall balance assessment: Needs assistance Sitting-balance support: Bilateral upper extremity supported;Feet supported Sitting balance-Leahy Scale: Good  Standing balance support: Bilateral upper extremity supported Standing balance-Leahy Scale: Fair Standing balance comment: Pt initially highly reliant on walker with minimal R WBing tolerance, improved with increased time standing                            Cognition Arousal/Alertness: Awake/alert Behavior During Therapy: WFL for tasks assessed/performed Overall Cognitive Status: Within Functional Limits for tasks assessed                                         Exercises Total Joint Exercises Heel Slides: AAROM;AROM;10 reps(resisted leg extensions, AAROM HS first 5 reps) Hip ABduction/ADduction: 10 reps;AROM;AAROM;Strengthening(pt c/o pain with ABd, did need AAROM, light resist ADd)    General Comments General comments (skin integrity, edema, etc.): saw pt post bolus, BP in sitting was 124/73, actually increased with standing and pt had no dizziness, etc with transitions and t/o prolonged ambulation bout      Pertinent Vitals/Pain Pain Assessment: 0-10 Pain Score: 5  Pain Location: R hip Pain Intervention(s): Patient requesting pain meds-RN notified    Home Living                      Prior Function            PT Goals (current goals can now be found in the care plan section) Progress towards PT goals: Progressing toward goals    Frequency    BID      PT Plan Current plan remains appropriate    Co-evaluation              AM-PAC PT "6 Clicks" Mobility   Outcome Measure  Help needed turning from your back to your side while in a flat bed without using bedrails?: A Little Help needed moving from lying on your back to sitting on the side of a flat bed without using bedrails?: A Little Help needed moving to and from a bed to a chair (including a wheelchair)?: A Little Help needed standing up from a chair using your arms (e.g., wheelchair or bedside chair)?: A Little Help needed to walk in hospital room?: A Little Help needed climbing 3-5 steps with a railing? : A Little 6 Click Score: 18    End of Session Equipment Utilized During Treatment: Gait belt Activity Tolerance: Patient tolerated treatment well Patient left: with call bell/phone within reach;with bed alarm set Nurse Communication: Patient requests pain meds;Mobility status(stable vitals with activity) PT Visit Diagnosis: Unsteadiness on feet (R26.81);Muscle weakness (generalized) (M62.81);Difficulty in walking, not elsewhere  classified (R26.2);Pain Pain - Right/Left: Right Pain - part of body: Hip     Time: 4098-1191 PT Time Calculation (min) (ACUTE ONLY): 41 min  Charges:  $Gait Training: 23-37 mins $Therapeutic Exercise: 8-22 mins                     Malachi Pro, DPT 10/27/2019, 5:34 PM

## 2019-10-27 NOTE — Evaluation (Signed)
Occupational Therapy Evaluation Patient Details Name: Alicia Calderon MRN: [id number] DOB: Feb 16, 1956 Today's Date: 10/27/2019    History of Present Illness Alicia Calderon a 64 y.o.femalethat presents s/prighttotal hip arthroplasty on 2/22/21by Dr. Ernest Pine. 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 Ernest Pine. Patient states the left hip is doing very well. She has no complaints.Of note, patient was previously seen by podiatry for cellulitis and abscess of the left great toe. Patient states she has completed 2 courses ofantibiotics andthe toe is much improved.   Clinical Impression   Alicia Calderon was seen for OT evaluation this date, POD#1 from above surgery. Pt was independent in all ADLs prior to surgery, however occasionally modifying dressing/bathing routines for LB access due to R hip pain. Of note, pt is s/p L THA on 05/06/2019. Pt is eager to return to PLOF with less pain and improved safety and independence.  Pt currently requires MAX assist for LB dressing while bed level due to pain and limited AROM of R hip. Pt able to recall 2/3 posterior total hip precautions at start of session and unable to verbalize how to implement during ADL and mobility. Pt instructed in posterior total hip precautions and how to implement, self care skills, falls prevention strategies, home/routines modifications, DME/AE for LB bathing and dressing tasks, and compression stocking mgt strategies. Pt would benefit from additional instruction in self care skills and techniques to help maintain precautions with or without assistive devices to support recall and carryover prior to discharge. Recommend HHOT upon discharge.      Follow Up Recommendations  Home health OT;Supervision/Assistance - 24 hour    Equipment Recommendations  3 in 1 bedside commode    Recommendations for Other Services       Precautions / Restrictions Precautions Precautions: Posterior  Hip Restrictions Weight Bearing Restrictions: Yes RLE Weight Bearing: Weight bearing as tolerated      Mobility Bed Mobility Overal bed mobility: Needs Assistance Bed Mobility: Supine to Sit     Supine to sit: Min assist;HOB elevated     General bed mobility comments: MIN A sup>sit to manage RLE c HOB ~60*. Pt Independently maintained posterior hip precautions t/o mobility.   Transfers Overall transfer level: Needs assistance Equipment used: Rolling walker (2 wheeled) Transfers: Sit to/from [employer] Sit to Stand: Min assist Stand pivot transfers: Min assist       General transfer comment: MIN A + RW sit<>stand c MIN VCs for pain management techniques (PLB, weight shifting).     Balance Overall balance assessment: Needs assistance Sitting-balance support: Bilateral upper extremity supported;Feet supported Sitting balance-Leahy Scale: Good     Standing balance support: Bilateral upper extremity supported Standing balance-Leahy Scale: Fair                             ADL either performed or assessed with clinical judgement   ADL Overall ADL's : Needs assistance/impaired                                       General ADL Comments: Donned L sock at bed level c Setup + Increased time while maintaining posterior hip precautions. MAX A don R sock and don/doff BLE foot SCDs at bed level. Independently performed bimanual grooming task seated in recliner.     Vision Baseline Vision/History:  Wears glasses Wears Glasses: Distance only(Pt states wears glasses to drive) Patient Visual Report: No change from baseline       Perception     Praxis      Pertinent Vitals/Pain Pain Assessment: No/denies pain     Hand Dominance Right   Extremity/Trunk Assessment Upper Extremity Assessment Upper Extremity Assessment: Overall WFL for tasks assessed   Lower Extremity Assessment Lower Extremity Assessment: Overall WFL for tasks  assessed   Cervical / Trunk Assessment Cervical / Trunk Assessment: Normal   Communication Communication Communication: No difficulties   Cognition Arousal/Alertness: Awake/alert Behavior During Therapy: WFL for tasks assessed/performed Overall Cognitive Status: Within Functional Limits for tasks assessed                                 General Comments: Pt recalled 2/3 posterior hip precautions from recent (Sept 2020) L THA.    General Comments  L great toe redness visualized as noted in chart.     Exercises Exercises: Other exercises Other Exercises Other Exercises: Pt educated re: functional application of posterior hip precautions, safe RW technique, AE use for dressing/bathing, compression stocking management, home environment/routine modifications, IS frequency and use, pain management techniques Other Exercises: bed mobility, sit<>stand, PLB, don L sock, simulated toilet t/f, grooming tasks   Shoulder Instructions      Home Living Family/patient expects to be discharged to:: Private residence Living Arrangements: Other (Comment);Parent(plans to D/C to 60 yo mothers house c brother nearby PRN) Available Help at Discharge: Family;Available 24 hours/day;Available PRN/intermittently(Mother available 24/7, brother available PRN) Type of Home: House Home Access: Stairs to enter [employer] of Steps: 1-2(1 step + threshhold ) Entrance Stairs-Rails: None Home Layout: One level     Bathroom Shower/Tub: Producer, television/film/video: Handicapped height Bathroom Accessibility: Yes How Accessible: Accessible via walker Home Equipment: Grab bars - tub/shower;Hand held shower head;Hospital bed          Prior Functioning/Environment Level of Independence: Independent        Comments: Pt states prior to surgery she was Independent in community mobility, was able to navigate stairs but with pain, and had to sit to dry off after bathing.         OT Problem List: Decreased strength;Decreased range of motion;Decreased knowledge of use of DME or AE      OT Treatment/Interventions: Self-care/ADL training;Therapeutic exercise;Energy conservation;DME and/or AE instruction;Therapeutic activities;Patient/family education    OT Goals(Current goals can be found in the care plan section) Acute Rehab OT Goals Patient Stated Goal: To walk up stairs normally OT Goal Formulation: With patient Time For Goal Achievement: 11/10/19 Potential to Achieve Goals: Good ADL Goals Pt Will Perform Lower Body Dressing: with supervision;with adaptive equipment;sit to/from stand Pt Will Transfer to Toilet: with modified independence;ambulating(c LRAD PRN) Pt Will Perform Toileting - Clothing Manipulation and hygiene: with min guard assist;with adaptive equipment;sit to/from stand(c AE PRN for safety and maintaining posterior hip precautions)  OT Frequency: Min 1X/week   Barriers to D/C: Decreased caregiver support          Co-evaluation              AM-PAC OT "6 Clicks" Daily Activity     Outcome Measure Help from another person eating meals?: None Help from another person taking care of personal grooming?: None Help from another person toileting, which includes using toliet, bedpan, or urinal?: A Little Help from another person  bathing (including washing, rinsing, drying)?: A Little Help from another person to put on and taking off regular upper body clothing?: A Little Help from another person to put on and taking off regular lower body clothing?: A Lot 6 Click Score: 19   End of Session Equipment Utilized During Treatment: Gait belt;Rolling walker  Activity Tolerance: Patient tolerated treatment well Patient left: in chair;with call bell/phone within reach;with chair alarm set;Other (comment)(PT in room at end of session)  OT Visit Diagnosis: Unsteadiness on feet (R26.81);Other abnormalities of gait and mobility (R26.89)                 Time: 8299-3716 OT Time Calculation (min): 37 min Charges:  OT General Charges $OT Visit: 1 Visit OT Evaluation $OT Eval Moderate Complexity: 1 Mod OT Treatments $Self Care/Home Management : 23-37 mins  Dessie Coma, M.S. OTR/L  10/27/19, 10:39 AM

## 2019-10-27 NOTE — Evaluation (Signed)
Physical Therapy Evaluation Patient Details Name: Alicia Calderon MRN: [id number] DOB: March 24, 1956 Today's Date: 10/27/2019   History of Present Illness  64 y.o.female s/prighttotal hip arthroplasty 10/26/19 (previously left total hip arthroplasty on 05/06/2019) Patient states the left hip is doing very well, no complaints.Previously seen by podiatry for cellulitis and abscess of the left great toe. Patient states she has completed 2 courses ofantibiotics andthe toe is much improved.  Clinical Impression  Pt showed good effort with PT and was eager to do all she could, however in standing she had seemingly orthostatic issues (BP 68/44) when she returned to sitting with feeling of almost passing out and sweats - nursing notified and assisting pt post session.  She was able to do exercises for R LE, but was pain limited and showed more weakness than expected give her successful L total hip a few months ago.  Pt able to recall precautions well and showed good awareness with those during mobility, pt able to put weight down through R heel but only after cuing and encouragement, unable to further work on this during ambulation this as she had the orthostatic symptoms.  Pt eager to do all she can this afternoon's session.    Follow Up Recommendations Home health PT(per continued progress)    Equipment Recommendations  None recommended by PT    Recommendations for Other Services       Precautions / Restrictions Precautions Precautions: Posterior Hip Precaution Booklet Issued: Yes (comment) Restrictions Weight Bearing Restrictions: Yes RLE Weight Bearing: Weight bearing as tolerated      Mobility  Bed Mobility Overal bed mobility: Needs Assistance Bed Mobility: Supine to Sit     Supine to sit: Min assist;HOB elevated     General bed mobility comments: (min assist per OT, pt in recliner on arrival)  Transfers Overall transfer level: Needs assistance Equipment used: Rolling walker  (2 wheeled) Transfers: Sit to/from Stand Sit to Stand: Min guard Stand pivot transfers: Min assist       General transfer comment: Pt slow to rise and heavily reliant on UEs - she did not need direct assist  Ambulation/Gait Ambulation/Gait assistance: Min assist Gait Distance (Feet): 6 Feet Assistive device: Rolling walker (2 wheeled)       General Gait Details: Pt was able to take a few guarded steps with poor WBing tolerance on R but started to to have some lightheadedness and reports feeling like she is going to pass out.  Returned her to sitting and BP taken, 68/44 - nursing informed, pt feeling okay relatively quickly in sitting, however BP remained low (70s/40s) 5 minutes later.    Stairs            Wheelchair Mobility    Modified Rankin (Stroke Patients Only)       Balance Overall balance assessment: Needs assistance Sitting-balance support: Bilateral upper extremity supported;Feet supported Sitting balance-Leahy Scale: Good     Standing balance support: Bilateral upper extremity supported Standing balance-Leahy Scale: Fair Standing balance comment: Pt only able to tolerate minimal time in standing secondary to BP drop, reliant on walker                              Pertinent Vitals/Pain Pain Assessment: 0-10 Pain Score: 2  Pain Location: R hip    Home Living Family/patient expects to be discharged to:: Private residence Living Arrangements: Other (Comment);Parent Available Help at Discharge: (mother) Type of Home: House Home  Access: Stairs to enter Entrance Stairs-Rails: None Entrance Stairs-Number of Steps: 1-2 Home Layout: One level Home Equipment: Grab bars - tub/shower;Hand held shower head;Hospital bed;Walker - 2 wheels      Prior Function Level of Independence: Independent         Comments: Pt states prior to surgery she was Independent in community mobility, was able to navigate stairs but with pain, and had to sit to dry  off after bathing.     Hand Dominance   Dominant Hand: Right    Extremity/Trunk Assessment   Upper Extremity Assessment Upper Extremity Assessment: Overall WFL for tasks assessed    Lower Extremity Assessment Lower Extremity Assessment: (expected R sided post-op weakness, pain with all mvt)    Cervical / Trunk Assessment Cervical / Trunk Assessment: Normal  Communication   Communication: No difficulties  Cognition Arousal/Alertness: Awake/alert Behavior During Therapy: WFL for tasks assessed/performed Overall Cognitive Status: Within Functional Limits for tasks assessed                                 General Comments: Pt recalled 3/4 posterior hip precautions from recent (Sept 2020) L THA.       General Comments General comments (skin integrity, edema, etc.): L great toe redness visualized as noted in chart.     Exercises Total Joint Exercises Ankle Circles/Pumps: AROM;10 reps Quad Sets: AROM;10 reps Gluteal Sets: AROM;10 reps Hip ABduction/ADduction: AROM;10 reps Long Arc Quad: Strengthening;10 reps Knee Flexion: Strengthening;10 reps Marching in Standing: AROM;AAROM;10 reps;Seated(AAROM for first 5 reps) Other Exercises Other Exercises: Pt educated re: functional application of posterior hip precautions, safe RW technique, AE use for dressing/bathing, compression stocking management, home environment/routine modifications, IS frequency and use, pain management techniques Other Exercises: bed mobility, sit<>stand, PLB, don L sock, simulated toilet t/f, grooming tasks   Assessment/Plan    PT Assessment Patient needs continued PT services  PT Problem List Decreased strength;Decreased range of motion;Decreased activity tolerance;Decreased mobility;Decreased cognition;Decreased safety awareness;Pain;Decreased knowledge of use of DME;Decreased knowledge of precautions;Decreased balance       PT Treatment Interventions DME instruction;Gait training;Stair  training;Functional mobility training;Therapeutic activities;Therapeutic exercise;Balance training;Patient/family education;Neuromuscular re-education    PT Goals (Current goals can be found in the Care Plan section)  Acute Rehab PT Goals Patient Stated Goal: get back to walking well PT Goal Formulation: With patient Time For Goal Achievement: 11/10/19 Potential to Achieve Goals: Good    Frequency BID   Barriers to discharge        Co-evaluation               AM-PAC PT "6 Clicks" Mobility  Outcome Measure Help needed turning from your back to your side while in a flat bed without using bedrails?: A Little Help needed moving from lying on your back to sitting on the side of a flat bed without using bedrails?: A Little Help needed moving to and from a bed to a chair (including a wheelchair)?: A Little Help needed standing up from a chair using your arms (e.g., wheelchair or bedside chair)?: A Little Help needed to walk in hospital room?: A Lot Help needed climbing 3-5 steps with a railing? : A Lot 6 Click Score: 16    End of Session Equipment Utilized During Treatment: Gait belt Activity Tolerance: Patient tolerated treatment well Patient left: with chair alarm set;with call bell/phone within reach Nurse Communication: (drop in BP) PT Visit Diagnosis: Unsteadiness on feet (  R26.81);Muscle weakness (generalized) (M62.81);Difficulty in walking, not elsewhere classified (R26.2);Pain Pain - Right/Left: Right Pain - part of body: Hip    Time: 3818-2993 PT Time Calculation (min) (ACUTE ONLY): 31 min   Charges:   PT Evaluation $PT Eval Low Complexity: 1 Low PT Treatments $Therapeutic Exercise: 8-22 mins $Therapeutic Activity: 8-22 mins        Malachi Pro, DPT 10/27/2019, 11:09 AM

## 2019-10-28 LAB — SURGICAL PATHOLOGY

## 2019-10-28 MED ORDER — ENOXAPARIN SODIUM 40 MG/0.4ML ~~LOC~~ SOLN: 40.0000 mg | SUBCUTANEOUS | 0 refills | Status: AC

## 2019-10-28 MED ORDER — CELECOXIB 200 MG PO CAPS: 200.0000 mg | ORAL_CAPSULE | Freq: Two times a day (BID) | ORAL | 0 refills | Status: AC

## 2019-10-28 MED ORDER — TRAMADOL HCL 50 MG PO TABS: 50.0000 mg | ORAL_TABLET | ORAL | 0 refills | Status: AC | PRN

## 2019-10-28 MED ORDER — OXYCODONE HCL 5 MG PO TABS: 5.0000 mg | ORAL_TABLET | ORAL | 0 refills | Status: DC | PRN

## 2019-10-28 NOTE — TOC Transition Note (Signed)
Transition of Care [facility]) - CM/SW Discharge Note   Patient Details  Name: Alicia Calderon MRN: [id number] Date of Birth: 01-03-1956  [facility]) CM/SW Contact:  Barrie Dunker, RN Phone Number: 10/28/2019, 8:36 AM   Clinical Narrative:    The patient Discharging home to her mothers today with [facility] set up with [facility] , She is aware of the price of the Lovenox and can afford her medications, She has no DME needs as she has a RW at home from a previous surgery. CM signing off   Final next level of care: Home w Home Health Services Barriers to Discharge: Barriers Resolved   Patient Goals and CMS Choice Patient states their goals for this hospitalization and ongoing recovery are:: go to her moms      Discharge Placement                       Discharge Plan and Services   Discharge Planning Services: CM Consult            DME Arranged: N/A         HH Arranged: PT HH Agency: [facility] ([facility] Date HH Agency Contacted: 10/27/19 Time HH Agency Contacted: 1552 Representative spoke with at [facility] Agency: Rosey Bath  Social Determinants of Health (SDOH) Interventions     Readmission Risk Interventions No flowsheet data found.

## 2019-10-28 NOTE — Progress Notes (Signed)
D: Pt alert and oriented x 4. Pt denies experiencing any pain at this time.  A: Pt received discharge and medication education/information. Pt belongings were gathered and taken with pt upon discharge to include ted hose and 2 dressing changes.   R: Pt verbalized understanding of discharge and medication education/information.  Pt escorted to medical mall front lobby via wheelchair by staff where family picked pt up.

## 2019-10-28 NOTE — Progress Notes (Signed)
  Subjective: 2 Days Post-Op Procedure(s) (LRB): TOTAL HIP ARTHROPLASTY (Right) Patient reports pain as well-controlled.   Patient is well, and has had no acute complaints or problems Plan is to go Home after hospital stay. Negative for chest pain and shortness of breath Fever: no Gastrointestinal: negative for nausea and vomiting.  Patient has had a bowel movement.  Objective: Vital signs in last 24 hours: Temp:  [97.8 F (36.6 C)-98.1 F (36.7 C)] 98.1 F (36.7 C) (02/24 0000) Pulse Rate:  [72-82] 80 (02/24 0000) Resp:  [16-17] 16 (02/24 0000) BP: (96-116)/(50-72) 109/72 (02/24 0000) SpO2:  [97 %-99 %] 97 % (02/24 0000)  Intake/Output from previous day:  Intake/Output Summary (Last 24 hours) at 10/28/2019 0722 Last data filed at 10/28/2019 0500 Gross per 24 hour  Intake 2235.22 ml  Output 240 ml  Net 1995.22 ml    Intake/Output this shift: No intake/output data recorded.  Labs: No results for input(s): HGB in the last 72 hours. No results for input(s): WBC, RBC, HCT, PLT in the last 72 hours. No results for input(s): NA, K, CL, CO2, BUN, CREATININE, GLUCOSE, CALCIUM in the last 72 hours. No results for input(s): LABPT, INR in the last 72 hours.   EXAM General - Patient is Alert, Appropriate and Oriented Extremity - Neurovascular intact Dorsiflexion/Plantar flexion intact Compartment soft  Dressing/Incision -clean, dry, no drainage, Hemovac in place.  Motor Function - intact, moving foot and toes well on exam.  Cardiovascular- Regular rate and rhythm, no murmurs/rubs/gallops Respiratory- Lungs clear to auscultation bilaterally Gastrointestinal- soft, nontender and active bowel sounds   Assessment/Plan: 2 Days Post-Op Procedure(s) (LRB): TOTAL HIP ARTHROPLASTY (Right) Active Problems:   Hx of total hip arthroplasty, right  Estimated body mass index is 22.55 kg/m as calculated from the following:   Height as of this encounter: 5\' 7"  (1.702 m).   Weight as of  this encounter: 65.3 kg. Advance diet Discharge home with home health  Hemovac removed.  Maintain posterior hip precautions.   DVT Prophylaxis - Lovenox, Ted hose and foot pumps Weight-Bearing as tolerated to right leg  , PA-C [facility] 10/28/2019, 7:22 AM

## 2019-10-28 NOTE — Progress Notes (Signed)
Physical Therapy Treatment Patient Details Name: Alicia Calderon MRN: [id number] DOB: 09-24-1955 Today's Date: 10/28/2019    History of Present Illness 64 y.o.female s/prighttotal hip arthroplasty 10/26/19 (previously left total hip arthroplasty on 05/06/2019) Patient states the left hip is doing very well, no complaints.Previously seen by podiatry for cellulitis and abscess of the left great toe. Patient states she has completed 2 courses ofantibiotics andthe toe is much improved.    PT Comments    Pt was long sitting in bed upon arriving. She agrees to PT session and reports no pain at rest. Pt is A and O x 4 and pleasant throughout session. Was able to recall all posterior hip precautions. Pt states she is excited to be D/C to mothers home today. Pt exited L side of bed with min A supporting RLE. Stood and ambulated 137ft with RW CGA/supervision. Was able to progress to step through gait pattern and tolerated gait training well. Once returned to pt's room, performed there ex handout with min to no assistance.Therapist reviewed car transfers and stairs. Pt with only one step to enter home and will have brother present to assist if needed. She was able to properly state correct way of performing step. Pt is seated in recliner post session with call bell in reach, LEs elevated, and resting comfortably. PT continues to recommend Home with HHPT at D/C.    Follow Up Recommendations  Home health PT     Equipment Recommendations  None recommended by PT    Recommendations for Other Services       Precautions / Restrictions Precautions Precautions: Posterior Hip Precaution Booklet Issued: Yes (comment) Precaution Comments: pt recalls 3/3 precautions from contralateral side in Sept Restrictions Weight Bearing Restrictions: Yes RLE Weight Bearing: Weight bearing as tolerated    Mobility  Bed Mobility Overal bed mobility: Needs Assistance Bed Mobility: Supine to Sit     Supine to  sit: Min assist;HOB elevated Sit to supine: (In recliner post ambulation)   General bed mobility comments: Pt required min assist with RLE support to exit L side of bed. she used trap-bar for assistance and will have one at her mothers upon d/c home today.  Transfers Overall transfer level: Needs assistance Equipment used: Rolling walker (2 wheeled) Transfers: Sit to/from Stand Sit to Stand: Min guard         General transfer comment: Pt was able to stand from slightly elevated bed height with CGA for safety only. no physical lifting required from therapist.  Ambulation/Gait Ambulation/Gait assistance: Supervision Gait Distance (Feet): 160 Feet Assistive device: Rolling walker (2 wheeled) Gait Pattern/deviations: Step-to pattern;Step-through pattern Gait velocity: decreased   General Gait Details: Pt has step to pattern at first but able to progress to step throughout after ~ 5 steps. She tolerated gait training well without LOB or unsteadiness.   Stairs Stairs: Yes(pt reports she only has one step to enter home. )       General stair comments: Pt refused stair training but several minute spent educating on proper sequencing and technique. She was able to use teach back to correctly state home she will perform steps in/out of home. " I've been doing it this way for awhile." Therapist also eductaed pt on proper car transfers and pt states understanding and reports she is confident she can perform.   Wheelchair Mobility    Modified Rankin (Stroke Patients Only)       Balance Overall balance assessment: Needs assistance Sitting-balance support: Feet supported;Bilateral upper extremity supported  Sitting balance-Leahy Scale: Good     Standing balance support: Bilateral upper extremity supported Standing balance-Leahy Scale: Good Standing balance comment: no LOB and even able to stand without UE support to address gown closure.                             Cognition Arousal/Alertness: Awake/alert Behavior During Therapy: WFL for tasks assessed/performed Overall Cognitive Status: Within Functional Limits for tasks assessed                                 General Comments: Pt was A and O x 4 and cooperative and motivated. She was able to recallhip precautions and maintain throughout session.      Exercises Total Joint Exercises Ankle Circles/Pumps: AROM;10 reps Quad Sets: AROM;10 reps Gluteal Sets: AROM;10 reps Short Arc Quad: AROM;Right;10 reps Heel Slides: AAROM;10 reps;Right Hip ABduction/ADduction: AAROM;Right;10 reps    General Comments        Pertinent Vitals/Pain Pain Assessment: No/denies pain Pain Score: 0-No pain Pain Location: R hip Pain Descriptors / Indicators: Guarding Pain Intervention(s): Limited activity within patient's tolerance;Monitored during session    Home Living                      Prior Function            PT Goals (current goals can now be found in the care plan section) Acute Rehab PT Goals Patient Stated Goal: To go home and back to walking well" Progress towards PT goals: Progressing toward goals    Frequency    BID      PT Plan Current plan remains appropriate    Co-evaluation              AM-PAC PT "6 Clicks" Mobility   Outcome Measure  Help needed turning from your back to your side while in a flat bed without using bedrails?: A Little Help needed moving from lying on your back to sitting on the side of a flat bed without using bedrails?: A Little Help needed moving to and from a bed to a chair (including a wheelchair)?: A Little Help needed standing up from a chair using your arms (e.g., wheelchair or bedside chair)?: A Little Help needed to walk in hospital room?: A Little Help needed climbing 3-5 steps with a railing? : A Little 6 Click Score: 18    End of Session Equipment Utilized During Treatment: Gait belt Activity Tolerance: Patient  tolerated treatment well Patient left: in chair;with call bell/phone within reach Nurse Communication: Mobility status PT Visit Diagnosis: Unsteadiness on feet (R26.81);Muscle weakness (generalized) (M62.81);Difficulty in walking, not elsewhere classified (R26.2);Pain Pain - Right/Left: Right Pain - part of body: Hip     Time: 7253-6644 PT Time Calculation (min) (ACUTE ONLY): 28 min  Charges:  $Gait Training: 8-22 mins $Therapeutic Exercise: 8-22 mins                     Julaine Fusi PTA 10/28/19, 9:21 AM

## 2019-10-28 NOTE — Discharge Summary (Signed)
Physician Discharge Summary  Patient ID: SHALAMAR PLOURDE MRN: [id number] DOB/AGE: 63-Apr-1957 63 y.o.  Admit date: 10/26/2019 Discharge date: 10/28/2019  Admission Diagnoses:  Hx of total hip arthroplasty, right [Z96.641]  Surgeries:Procedure(s):  Right total hip arthroplasty  SURGEON:  Marciano Sequin. M.D.  ASSISTANT: Cassell Smiles, PA-C (present and scrubbed throughout the case, critical for assistance with exposure, retraction, instrumentation, and closure)  ANESTHESIA: spinal  ESTIMATED BLOOD LOSS: 100 mL  FLUIDS REPLACED: 800 mL of crystalloid  DRAINS: 2 medium drains to a Hemovac reservoir  IMPLANTS UTILIZED: DePuy 13.5 mm small stature AML femoral stem, 48 mm OD Pinnacle 100 acetabular component, +4 mm 10 degree Pinnacle Marathon polyethylene insert, and a 32 mm CoCr +5 mm hip ball  Discharge Diagnoses: Patient Active Problem List   Diagnosis Date Noted  . Hx of total hip arthroplasty, right 10/26/2019  . Status post total hip replacement, left 05/07/2019  . Primary osteoarthritis of right hip 02/23/2019  . Osteopenia 09/05/2018    Past Medical History:  Diagnosis Date  . Arthritis   . Colon polyp   . Medical history non-contributory   . Osteopenia      Transfusion:    Consultants (if any):   Discharged Condition: Improved  Hospital Course: TONDA WIEDERHOLD is an 64 y.o. female who was admitted 10/26/2019 with a diagnosis of right hip osteoarthritis and went to the operating room on 10/26/2019 and underwent right total hip arthroplasty . The patient received perioperative antibiotics for prophylaxis (see below). The patient tolerated the procedure well and was transported to PACU in stable condition. After meeting PACU criteria, the patient was subsequently transferred to the Orthopaedics/Rehabilitation unit.   The patient received DVT prophylaxis in the form of early mobilization, Lovenox, Foot Pumps and TED hose. A sacral pad had been placed and heels were  elevated off of the bed with rolled towels in order to protect skin integrity. Foley catheter was discontinued on postoperative day #1. Wound drains were discontinued on postoperative day #2. The surgical incision was healing well without signs of infection.  Physical therapy was initiated postoperatively for transfers, gait training, and strengthening. Occupational therapy was initiated for activities of daily living and evaluation for assisted devices. Rehabilitation goals were reviewed in detail with the patient. The patient made steady progress with physical therapy and physical therapy recommended discharge to Home.   The patient achieved his preliminary goals of this hospitalization and was felt to be medically and orthopaedically appropriate for discharge.  She was given perioperative antibiotics:  Anti-infectives (From admission, onward)   Start     Dose/Rate Route Frequency Ordered Stop   10/27/19 0600  ceFAZolin (ANCEF) IVPB 2g/100 mL premix     2 g 200 mL/hr over 30 Minutes Intravenous On call to O.R. 10/26/19 1200 10/26/19 1335   10/26/19 1930  ceFAZolin (ANCEF) IVPB 2g/100 mL premix     2 g 200 mL/hr over 30 Minutes Intravenous Every 6 hours 10/26/19 1835 10/27/19 1342    .  Recent vital signs:  Vitals:   10/27/19 1700 10/28/19 0000  BP: 113/65 109/72  Pulse: 82 80  Resp:  16  Temp:  98.1 F (36.7 C)  SpO2:  97%    Recent laboratory studies:  No results for input(s): WBC, HGB, HCT, PLT, K, CL, CO2, BUN, CREATININE, GLUCOSE, CALCIUM, LABPT, INR in the last 72 hours.  Diagnostic Studies: DG Toe Great Left  Result Date: 10/21/2019 CLINICAL DATA:  Cellulitis EXAM: LEFT GREAT TOE COMPARISON:  None. FINDINGS: No fracture or malalignment. No periostitis or bone destruction. No soft tissue emphysema. IMPRESSION: No acute osseous abnormality Electronically Signed   By: Jasmine Pang M.D.   On: 10/21/2019 20:13   DG Hip Port Unilat With Pelvis 1V Right  Result Date:  10/26/2019 CLINICAL DATA:  64 year old female status post right hip arthroplasty. EXAM: DG HIP (WITH OR WITHOUT PELVIS) 1V PORT RIGHT COMPARISON:  Pelvic radiograph dated 05/06/2019. FINDINGS: Status post right hip arthroplasty. The arthroplasty components appear intact and in anatomic alignment. Prior left hip arthroplasty is also noted. There is no acute fracture or dislocation. The bones are osteopenic. Right hip surgical drain and cutaneous clips. IMPRESSION: Right hip arthroplasty.  No immediate complication. Electronically Signed   By: Elgie Collard M.D.   On: 10/26/2019 19:11    Discharge Medications:   Allergies as of 10/28/2019   No Known Allergies     Medication List    STOP taking these medications   ibuprofen 200 MG tablet Commonly known as: ADVIL     TAKE these medications   acetaminophen 325 MG tablet Commonly known as: TYLENOL Take 650 mg by mouth every 6 (six) hours as needed for moderate pain or headache.   b complex vitamins tablet Take 1 tablet by mouth daily.   Calcium-Magnesium 500-250 MG Tabs Take 1 tablet by mouth daily at 6 (six) AM.   celecoxib 200 MG capsule Commonly known as: CELEBREX Take 1 capsule (200 mg total) by mouth 2 (two) times daily.   cephALEXin 500 MG capsule Commonly known as: KEFLEX Take 1 capsule (500 mg total) by mouth 4 (four) times daily for 10 days.   cholecalciferol 25 MCG (1000 UNIT) tablet Commonly known as: VITAMIN D3 Take 2,000 Units by mouth daily.   docusate sodium 50 MG capsule Commonly known as: COLACE Take 50 mg by mouth at bedtime as needed for mild constipation.   enoxaparin 40 MG/0.4ML injection Commonly known as: LOVENOX Inject 0.4 mLs (40 mg total) into the skin daily for 14 days.   oxyCODONE 5 MG immediate release tablet Commonly known as: Oxy IR/ROXICODONE Take 1 tablet (5 mg total) by mouth every 4 (four) hours as needed for moderate pain (pain score 4-6).   traMADol 50 MG tablet Commonly known as:  ULTRAM Take 1-2 tablets (50-100 mg total) by mouth every 4 (four) hours as needed for moderate pain.   vitamin C 1000 MG tablet Take 1,000 mg by mouth daily.   zinc gluconate 50 MG tablet Take 50 mg by mouth daily.            Durable Medical Equipment  (From admission, onward)         Start     Ordered   10/26/19 1836  DME Walker rolling  Once    Question:  Patient needs a walker to treat with the following condition  Answer:  S/P total hip arthroplasty   10/26/19 1835   10/26/19 1836  DME Bedside commode  Once    Question:  Patient needs a bedside commode to treat with the following condition  Answer:  S/P total hip arthroplasty   10/26/19 1835          Disposition: Home with home health    Follow-up Information    Donato Heinz, MD On 12/08/2019.   Specialty: Orthopedic Surgery Why: at 2:00pm Contact information: [address] [facility] [place] [address] [phone]  Khalfani Weideman, PA-C 10/28/2019, 7:25 AM

## 2020-01-19 ENCOUNTER — Ambulatory Visit: Payer: BC Managed Care – PPO | Attending: Internal Medicine

## 2020-01-19 DIAGNOSIS — Z23 Encounter for immunization: Secondary | ICD-10-CM

## 2020-01-19 NOTE — Progress Notes (Signed)
   Covid-19 Vaccination Clinic  Name:  SELINA TAPPER    MRN: [id number] DOB: August 28, 1956  01/19/2020  Ms. Masse was observed post Covid-19 immunization for 15 minutes without incident. She was provided with Vaccine Information Sheet and instruction to access the V-Safe system.   Ms. Myer was instructed to call 911 with any severe reactions post vaccine: Marland Kitchen Difficulty breathing  . Swelling of face and throat  . A fast heartbeat  . A bad rash all over body  . Dizziness and weakness   Immunizations Administered    Name Date Dose VIS Date Route   Pfizer COVID-19 Vaccine 01/19/2020 10:13 AM 0.3 mL 10/28/2018 Intramuscular   Manufacturer: [employer], [employer]   Lot: [id number]   NDC: [address]-2

## 2020-02-09 ENCOUNTER — Ambulatory Visit: Payer: BC Managed Care – PPO | Attending: Internal Medicine

## 2020-02-09 DIAGNOSIS — Z23 Encounter for immunization: Secondary | ICD-10-CM

## 2020-02-09 NOTE — Progress Notes (Signed)
   Covid-19 Vaccination Clinic  Name:  PAHOLA DIMMITT    MRN: [id number] DOB: [birth year]  02/09/2020  Ms. Keisler was observed post Covid-19 immunization for 15 minutes without incident. She was provided with Vaccine Information Sheet and instruction to access the V-Safe system.   Ms. Cawley was instructed to call 911 with any severe reactions post vaccine: Marland Kitchen Difficulty breathing  . Swelling of face and throat  . A fast heartbeat  . A bad rash all over body  . Dizziness and weakness   Immunizations Administered    Name Date Dose VIS Date Route   Pfizer COVID-19 Vaccine 02/09/2020 10:07 AM 0.3 mL 10/28/2018 Intramuscular   Manufacturer: [employer], [employer]   Lot: [id number]   NDC: [address]-1

## 2020-08-12 IMAGING — DX DG HIP (WITH OR WITHOUT PELVIS) 1V PORT*L*
2 series · 2 of 2 positions shown · non-contrast
Comparison: None.

CLINICAL DATA: Status post left hip replacement

EXAM:
DG HIP (WITH OR WITHOUT PELVIS) 1V PORT LEFT

[pelvis ap]
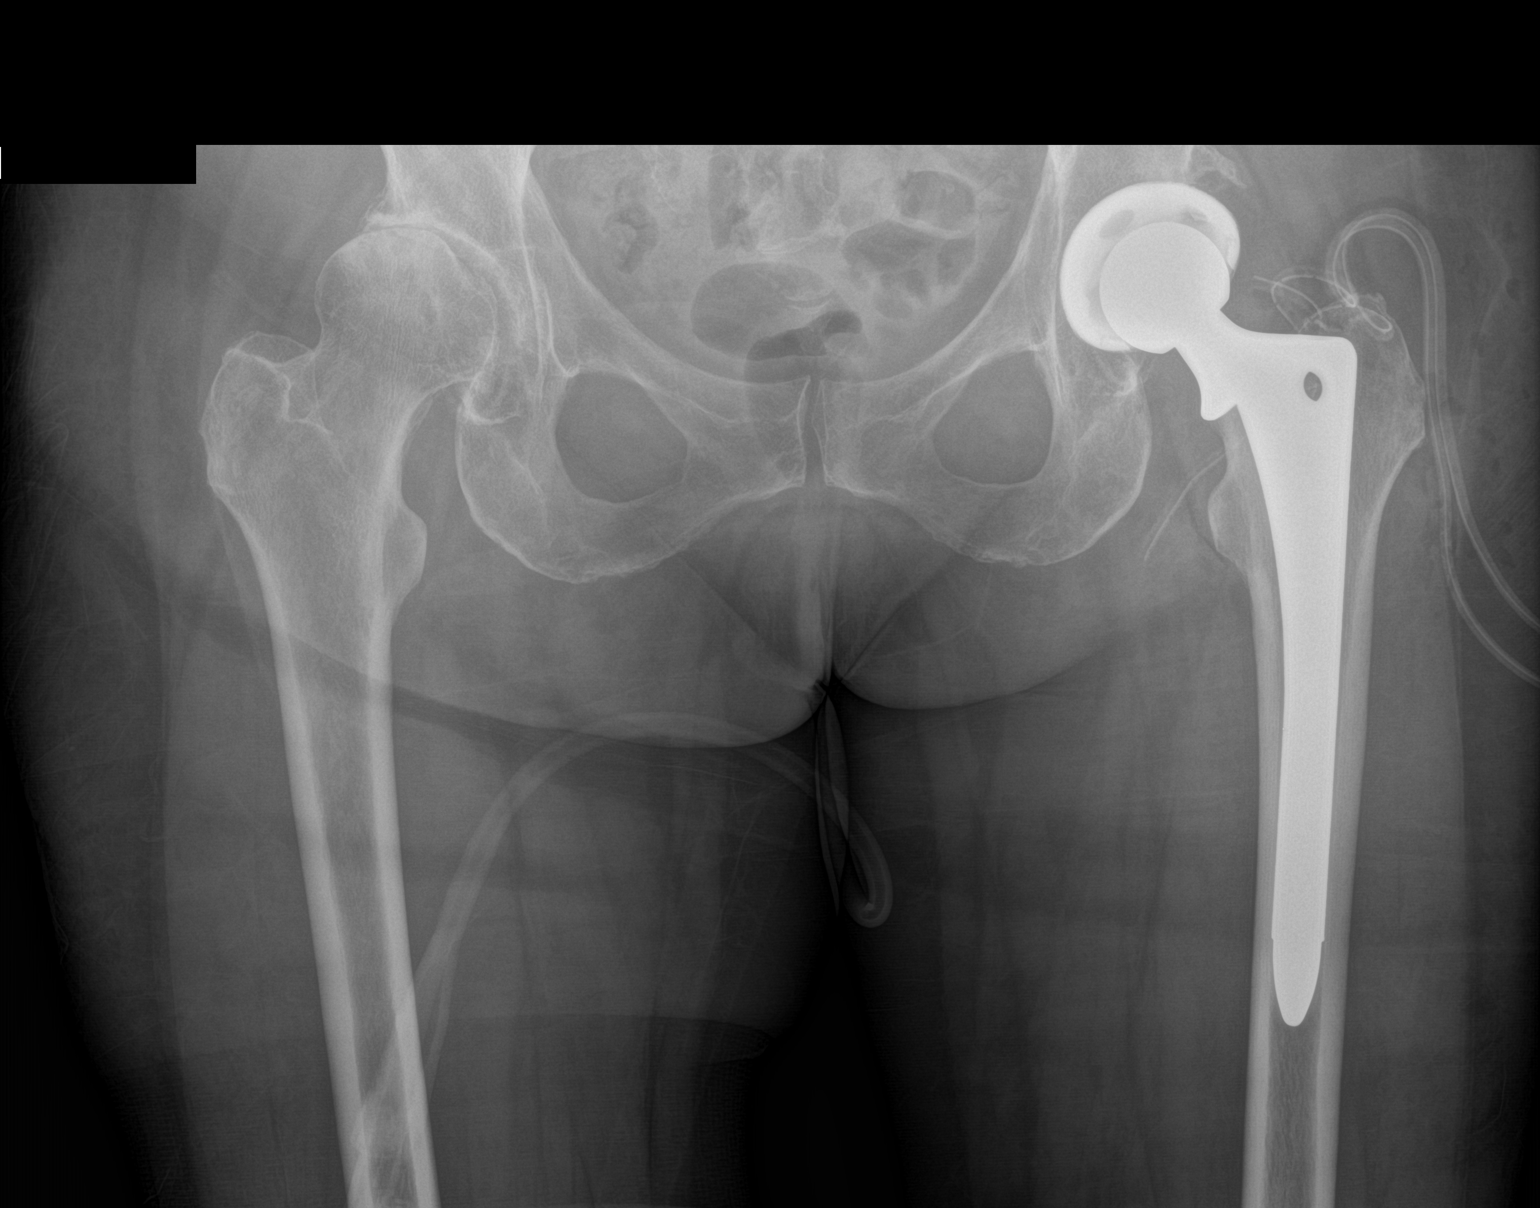

[hip lat]
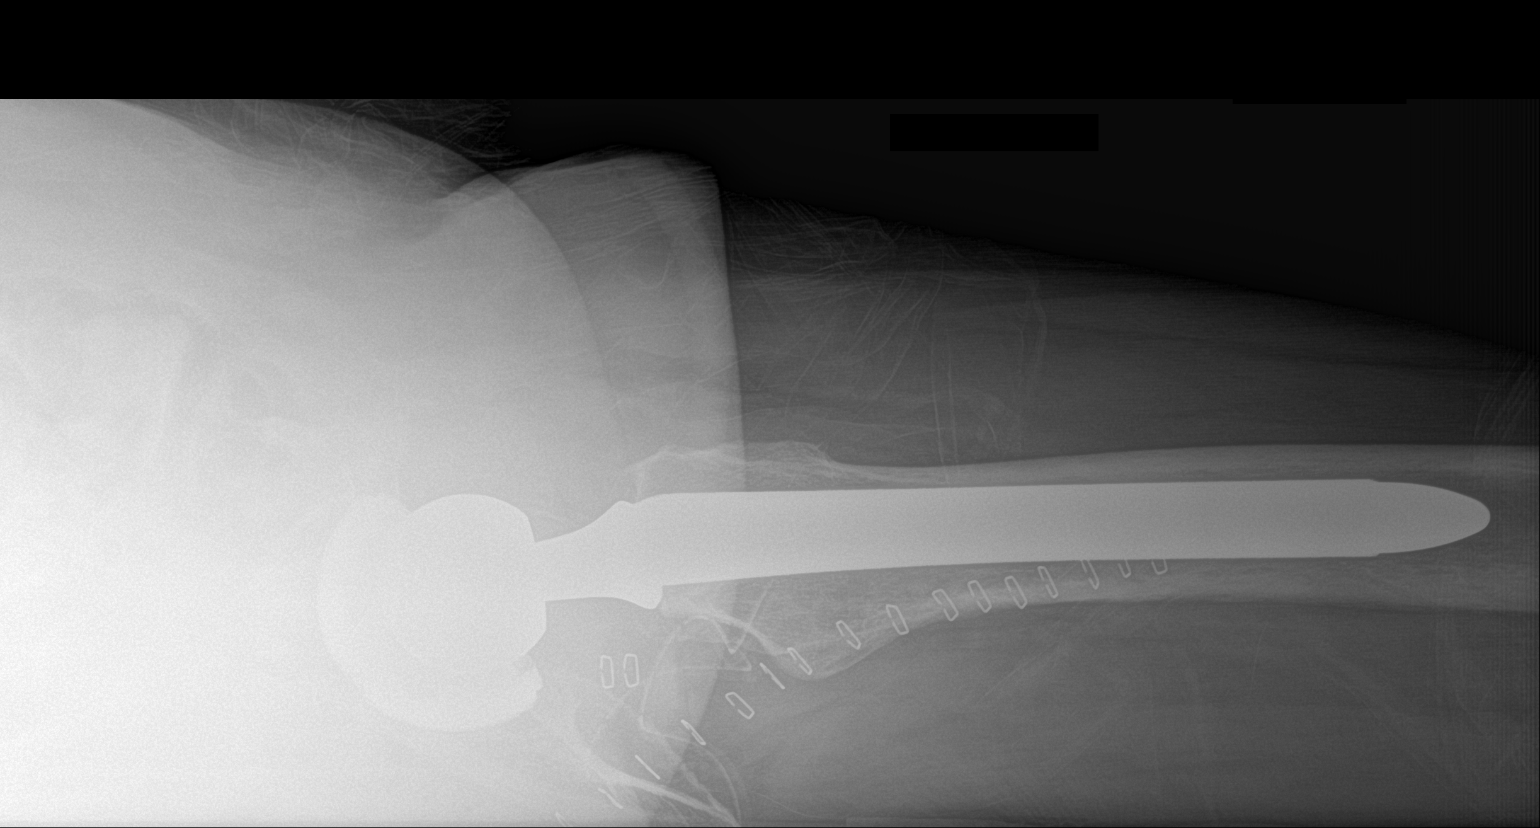

[2 of 2 positions shown; findings below may reference images not displayed]

FINDINGS: Left hip replacement is now seen. Surgical drains are noted in
place. No acute bony or soft tissue abnormality is noted.
IMPRESSION: Status post left hip replacement.

## 2021-01-27 IMAGING — CR DG TOE GREAT 2+V*L*
1 series · 3 of 3 positions shown · non-contrast
Comparison: None.

CLINICAL DATA: Cellulitis

EXAM:
LEFT GREAT TOE

[Series 1: dg toe great left · 0.14mm/px · 3 of 3 slices shown]
[im 1/3]
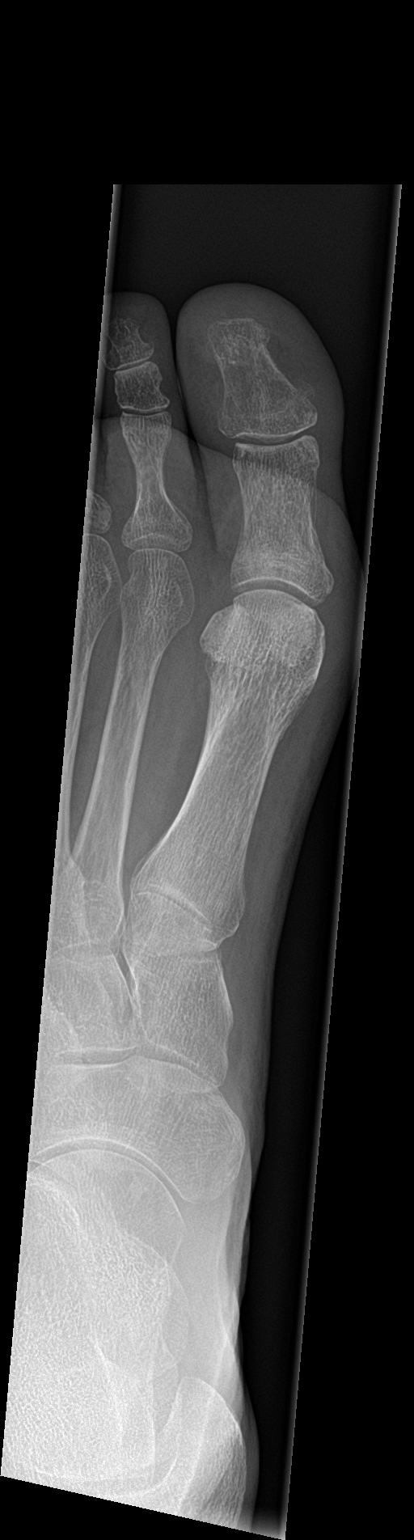
[im 2/3]
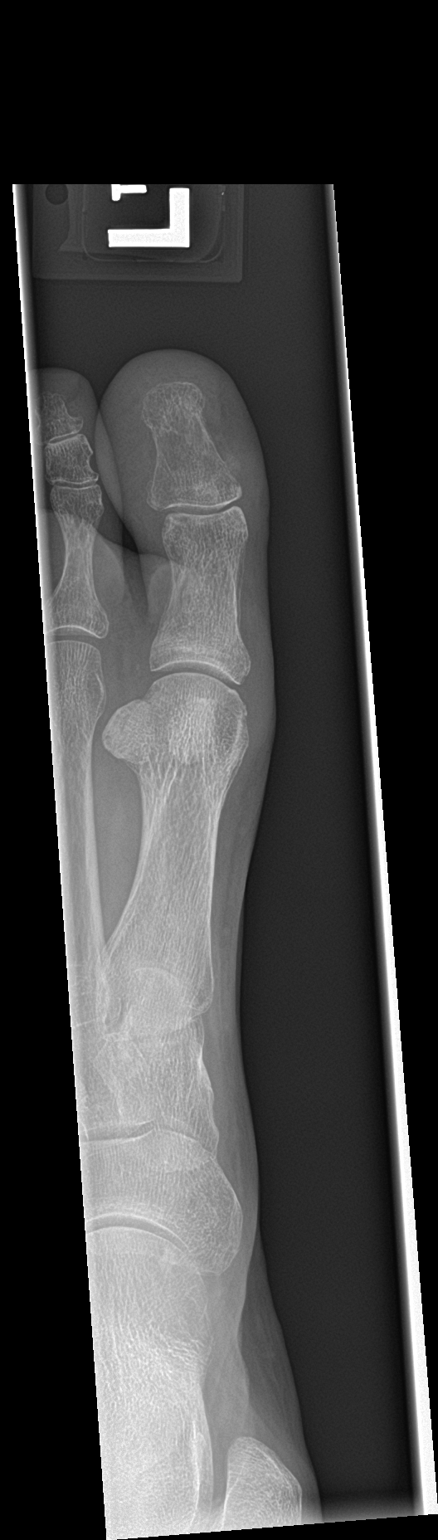
[im 3/3]
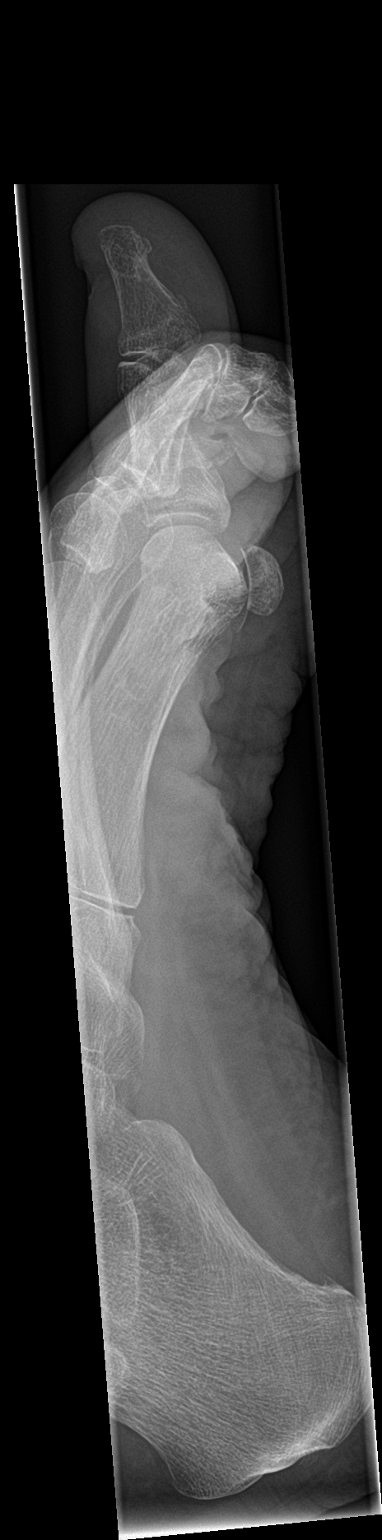

[3 of 3 positions shown; findings below may reference images not displayed]

FINDINGS: No fracture or malalignment. No periostitis or bone destruction. No
soft tissue emphysema.
IMPRESSION: No acute osseous abnormality

## 2021-02-01 IMAGING — DX DG HIP (WITH OR WITHOUT PELVIS) 1V PORT*R*
2 series · 2 of 2 positions shown · non-contrast
Comparison: Pelvic radiograph dated 05/06/2019.

CLINICAL DATA: 63-year-old female status post right hip
arthroplasty.

EXAM:
DG HIP (WITH OR WITHOUT PELVIS) 1V PORT RIGHT

[pelvis ap]
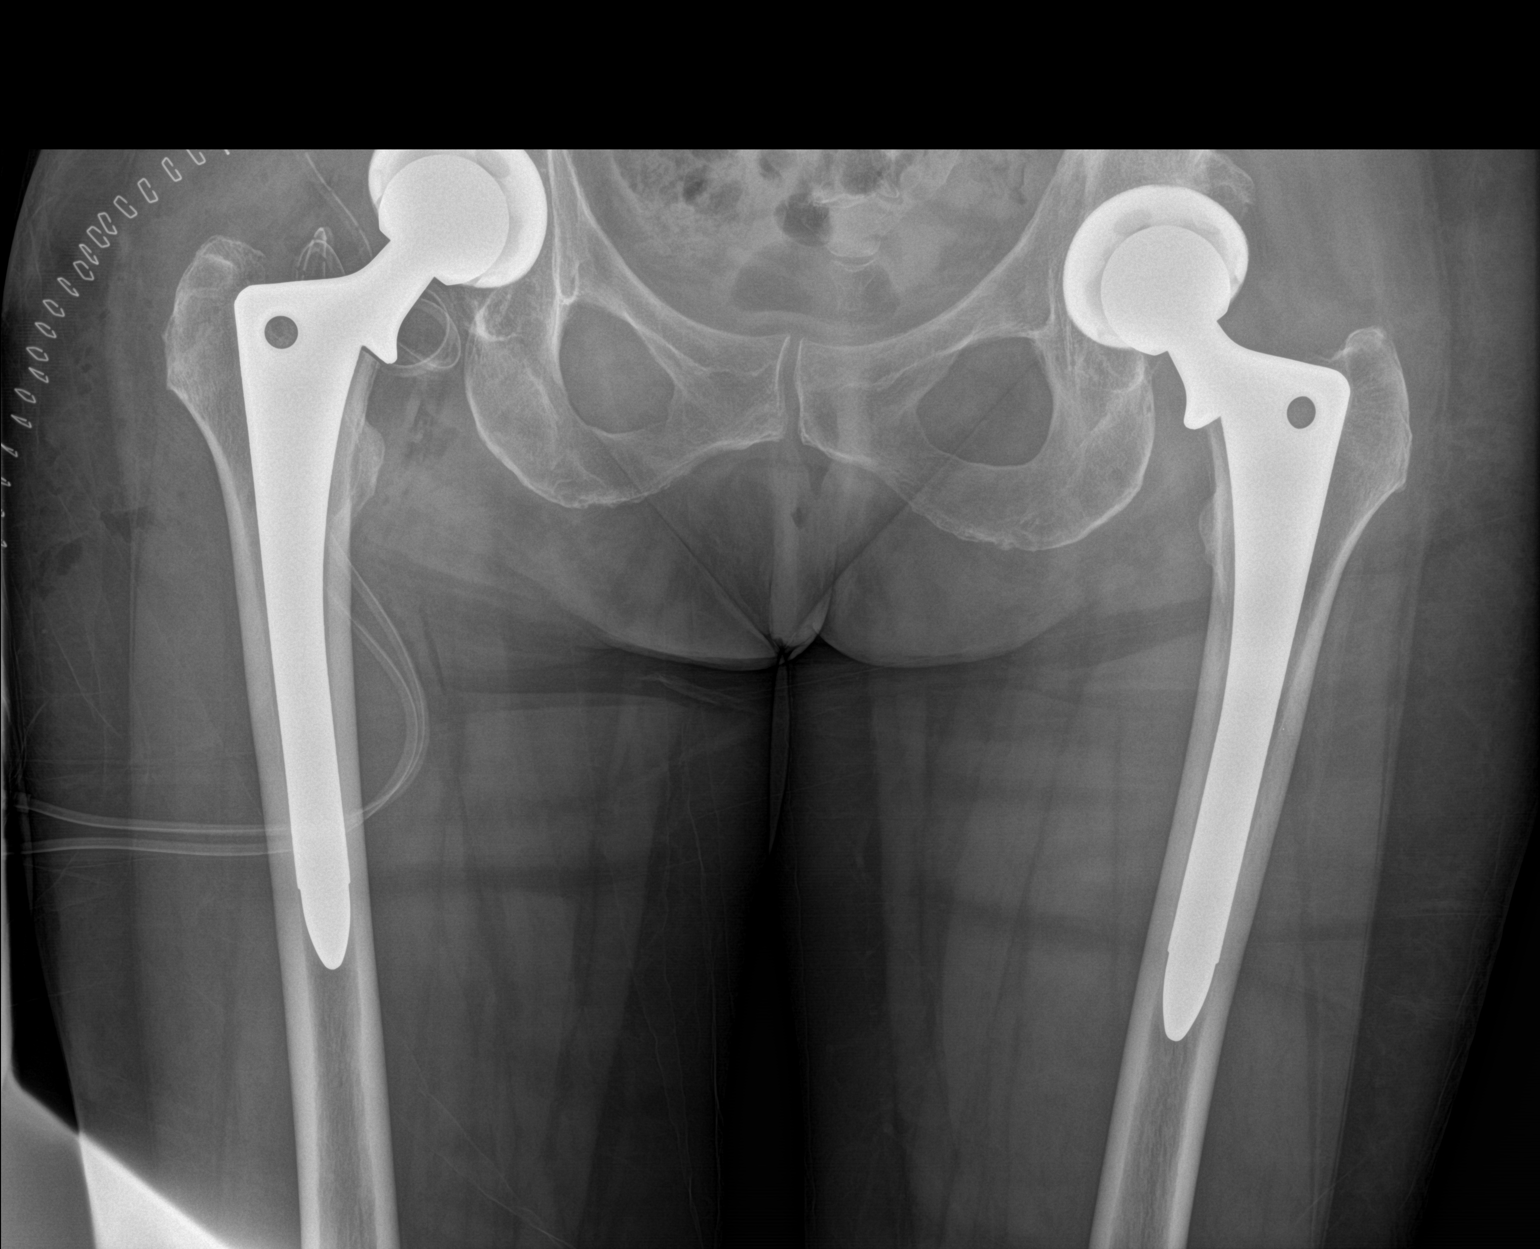

[hip lat]
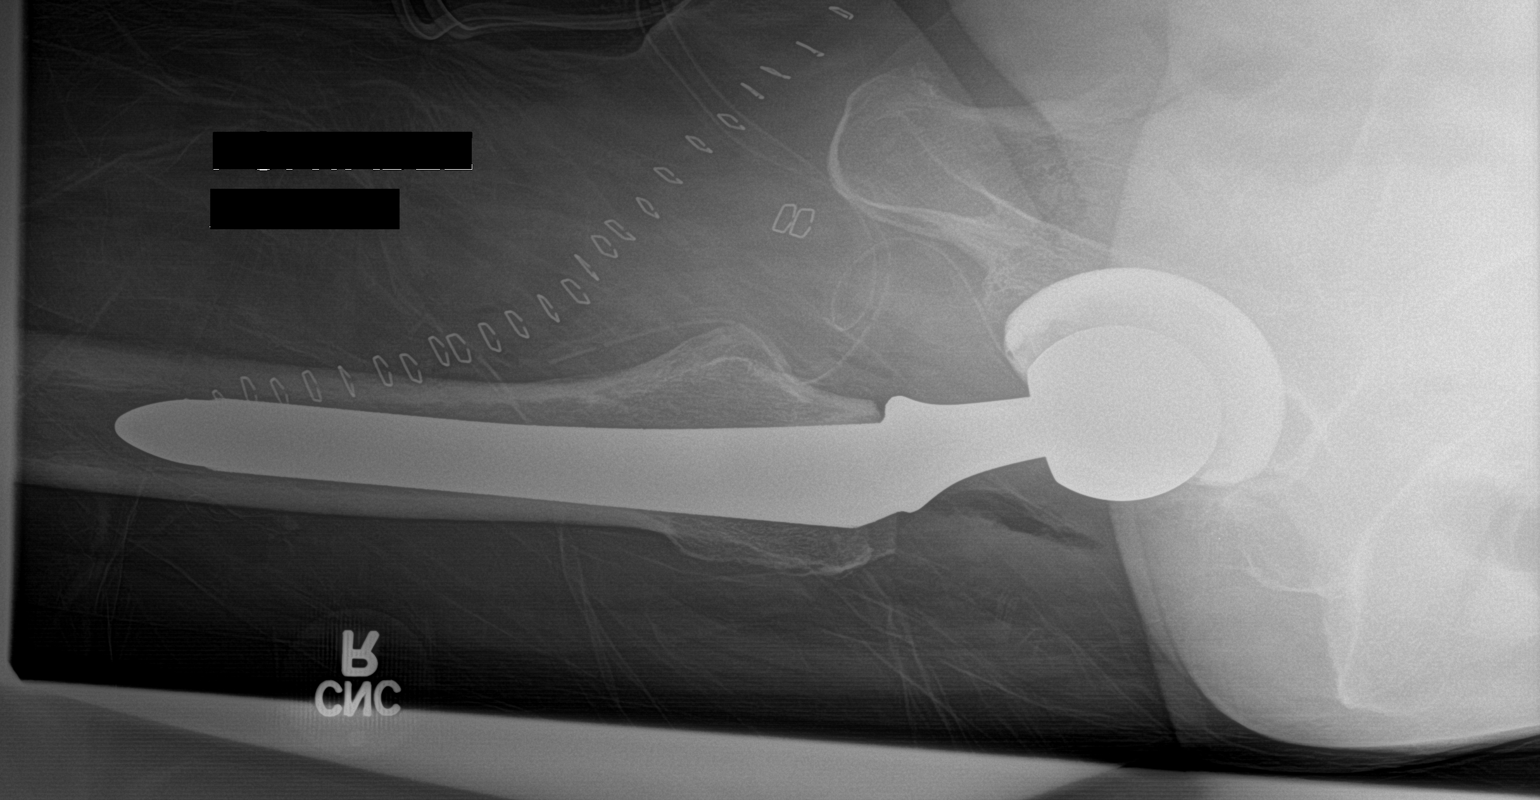

[2 of 2 positions shown; findings below may reference images not displayed]

FINDINGS: Status post right hip arthroplasty. The arthroplasty components
appear intact and in anatomic alignment. Prior left hip arthroplasty
is also noted. There is no acute fracture or dislocation. The bones
are osteopenic. Right hip surgical drain and cutaneous clips.
IMPRESSION: Right hip arthroplasty.  No immediate complication.

## 2021-04-30 ENCOUNTER — Observation Stay: Payer: BC Managed Care – PPO | Admitting: Anesthesiology

## 2021-04-30 ENCOUNTER — Encounter
Admission: EM | Disposition: A | Discharge status: Home / Self Care | Payer: Self-pay | Source: Home / Self Care | Attending: Emergency Medicine

## 2021-04-30 ENCOUNTER — Other Ambulatory Visit: Payer: Self-pay

## 2021-04-30 ENCOUNTER — Observation Stay
Admission: EM | Admit: 2021-04-30 | Discharge: 2021-05-02 | Disposition: A | Discharge status: Home / Self Care | Payer: BC Managed Care – PPO | Attending: Surgery | Admitting: Surgery

## 2021-04-30 ENCOUNTER — Emergency Department: Payer: BC Managed Care – PPO

## 2021-04-30 DIAGNOSIS — K429 Umbilical hernia without obstruction or gangrene: Secondary | ICD-10-CM

## 2021-04-30 DIAGNOSIS — Z20822 Contact with and (suspected) exposure to covid-19: Secondary | ICD-10-CM | POA: Diagnosis not present

## 2021-04-30 DIAGNOSIS — Z79899 Other long term (current) drug therapy: Secondary | ICD-10-CM | POA: Insufficient documentation

## 2021-04-30 DIAGNOSIS — Z96643 Presence of artificial hip joint, bilateral: Secondary | ICD-10-CM | POA: Insufficient documentation

## 2021-04-30 DIAGNOSIS — K3532 Acute appendicitis with perforation and localized peritonitis, without abscess: Secondary | ICD-10-CM | POA: Diagnosis not present

## 2021-04-30 DIAGNOSIS — K3580 Unspecified acute appendicitis: Secondary | ICD-10-CM | POA: Diagnosis present

## 2021-04-30 HISTORY — PX: LAPAROSCOPIC APPENDECTOMY: SHX408

## 2021-04-30 HISTORY — PX: UMBILICAL HERNIA REPAIR: SHX196

## 2021-04-30 LAB — URINALYSIS, COMPLETE (UACMP) WITH MICROSCOPIC
Bacteria, UA: NONE SEEN
Bilirubin Urine: NEGATIVE
Glucose, UA: NEGATIVE mg/dL
Hgb urine dipstick: NEGATIVE
Ketones, ur: NEGATIVE mg/dL
Leukocytes,Ua: NEGATIVE
Nitrite: NEGATIVE
Protein, ur: NEGATIVE mg/dL
Specific Gravity, Urine: 1.005 (ref 1.005–1.030)
Squamous Epithelial / HPF: NONE SEEN (ref 0–5)
pH: 7 (ref 5.0–8.0)

## 2021-04-30 LAB — COMPREHENSIVE METABOLIC PANEL
ALT: 23 U/L (ref 0–44)
AST: 28 U/L (ref 15–41)
Albumin: 4.3 g/dL (ref 3.5–5.0)
Alkaline Phosphatase: 57 U/L (ref 38–126)
Anion gap: 6 (ref 5–15)
BUN: 16 mg/dL (ref 8–23)
CO2: 29 mmol/L (ref 22–32)
Calcium: 9.7 mg/dL (ref 8.9–10.3)
Chloride: 103 mmol/L (ref 98–111)
Creatinine, Ser: 0.84 mg/dL (ref 0.44–1.00)
GFR, Estimated: >60 mL/min (ref >60)
Glucose, Bld: 120 mg/dL — ABNORMAL HIGH (ref 70–99)
Potassium: 3.9 mmol/L (ref 3.5–5.1)
Sodium: 138 mmol/L (ref 135–145)
Total Bilirubin: 0.8 mg/dL (ref 0.3–1.2)
Total Protein: 7.2 g/dL (ref 6.5–8.1)

## 2021-04-30 LAB — LIPASE, BLOOD: Lipase: 36 U/L (ref 11–51)

## 2021-04-30 LAB — CBC
HCT: 40.3 % (ref 36.0–46.0)
Hemoglobin: 13.5 g/dL (ref 12.0–15.0)
MCH: 30.5 pg (ref 26.0–34.0)
MCHC: 33.5 g/dL (ref 30.0–36.0)
MCV: 91 fL (ref 80.0–100.0)
Platelets: 302 10*3/uL (ref 150–400)
RBC: 4.43 MIL/uL (ref 3.87–5.11)
RDW: 13.4 % (ref 11.5–15.5)
WBC: 12.3 10*3/uL — ABNORMAL HIGH (ref 4.0–10.5)
nRBC: 0 % (ref 0.0–0.2)

## 2021-04-30 LAB — RESP PANEL BY RT-PCR (FLU A&B, COVID) ARPGX2
Influenza A by PCR: NEGATIVE
Influenza B by PCR: NEGATIVE
SARS Coronavirus 2 by RT PCR: NEGATIVE

## 2021-04-30 SURGERY — APPENDECTOMY, LAPAROSCOPIC
Anesthesia: General | Site: Abdomen

## 2021-04-30 MED ORDER — ROCURONIUM BROMIDE 100 MG/10ML IV SOLN
INTRAVENOUS | Status: DC | PRN
  Administered 2021-04-30: 50 mg via INTRAVENOUS

## 2021-04-30 MED ORDER — PROPOFOL 10 MG/ML IV BOLUS
INTRAVENOUS | Status: DC | PRN
  Administered 2021-04-30: 70 mg via INTRAVENOUS
  Administered 2021-04-30: 130 mg via INTRAVENOUS

## 2021-04-30 MED ORDER — PROPOFOL 10 MG/ML IV BOLUS
INTRAVENOUS | Status: AC
  Filled 2021-04-30: qty 20

## 2021-04-30 MED ORDER — ROCURONIUM BROMIDE 10 MG/ML (PF) SYRINGE
PREFILLED_SYRINGE | INTRAVENOUS | Status: AC
  Filled 2021-04-30: qty 20

## 2021-04-30 MED ORDER — ONDANSETRON HCL 4 MG/2ML IJ SOLN: 4.0000 mg | Freq: Four times a day (QID) | INTRAMUSCULAR | Status: DC | PRN

## 2021-04-30 MED ORDER — SUCCINYLCHOLINE CHLORIDE 200 MG/10ML IV SOSY
PREFILLED_SYRINGE | INTRAVENOUS | Status: DC | PRN
  Administered 2021-04-30: 120 mg via INTRAVENOUS

## 2021-04-30 MED ORDER — ONDANSETRON HCL 4 MG/2ML IJ SOLN
INTRAMUSCULAR | Status: AC
  Filled 2021-04-30: qty 2

## 2021-04-30 MED ORDER — LACTATED RINGERS IV SOLN
125.0000 mL/h | INTRAVENOUS | Status: DC
  Administered 2021-04-30: 125 mL/h via INTRAVENOUS

## 2021-04-30 MED ORDER — DEXAMETHASONE SODIUM PHOSPHATE 10 MG/ML IJ SOLN
INTRAMUSCULAR | Status: AC
  Filled 2021-04-30: qty 1

## 2021-04-30 MED ORDER — ENOXAPARIN SODIUM 40 MG/0.4ML IJ SOSY
40.0000 mg | PREFILLED_SYRINGE | INTRAMUSCULAR | Status: DC
  Administered 2021-05-01: 40 mg via SUBCUTANEOUS
  Filled 2021-04-30: qty 0.4

## 2021-04-30 MED ORDER — OXYCODONE HCL 5 MG PO TABS: 5.0000 mg | ORAL_TABLET | ORAL | Status: DC | PRN

## 2021-04-30 MED ORDER — LIDOCAINE HCL (PF) 2 % IJ SOLN
INTRAMUSCULAR | Status: AC
  Filled 2021-04-30: qty 5

## 2021-04-30 MED ORDER — FENTANYL CITRATE (PF) 100 MCG/2ML IJ SOLN
INTRAMUSCULAR | Status: DC | PRN
  Administered 2021-04-30: 100 ug via INTRAVENOUS

## 2021-04-30 MED ORDER — PIPERACILLIN-TAZOBACTAM 3.375 G IVPB
3.3750 g | Freq: Three times a day (TID) | INTRAVENOUS | Status: DC
  Administered 2021-05-01 – 2021-05-02 (×4): 3.375 g via INTRAVENOUS
  Filled 2021-04-30 (×8): qty 50

## 2021-04-30 MED ORDER — HYDROMORPHONE HCL 1 MG/ML IJ SOLN: 0.5000 mg | INTRAMUSCULAR | Status: DC | PRN

## 2021-04-30 MED ORDER — MIDAZOLAM HCL 2 MG/2ML IJ SOLN
INTRAMUSCULAR | Status: DC | PRN
Start: 2021-04-30 — End: 2021-04-30
  Administered 2021-04-30: 2 mg via INTRAVENOUS

## 2021-04-30 MED ORDER — SODIUM CHLORIDE 0.9 % IR SOLN
Status: DC | PRN
  Administered 2021-04-30: 300 mL

## 2021-04-30 MED ORDER — FENTANYL CITRATE (PF) 100 MCG/2ML IJ SOLN
INTRAMUSCULAR | Status: AC
  Filled 2021-04-30: qty 2

## 2021-04-30 MED ORDER — KETOROLAC TROMETHAMINE 30 MG/ML IJ SOLN
INTRAMUSCULAR | Status: DC | PRN
  Administered 2021-04-30: 30 mg via INTRAVENOUS

## 2021-04-30 MED ORDER — 0.9 % SODIUM CHLORIDE (POUR BTL) OPTIME
TOPICAL | Status: DC | PRN
  Administered 2021-04-30: 10 mL

## 2021-04-30 MED ORDER — ACETAMINOPHEN 10 MG/ML IV SOLN
INTRAVENOUS | Status: DC | PRN
  Administered 2021-04-30: 1000 mg via INTRAVENOUS

## 2021-04-30 MED ORDER — KETOROLAC TROMETHAMINE 30 MG/ML IJ SOLN
INTRAMUSCULAR | Status: AC
  Filled 2021-04-30: qty 1

## 2021-04-30 MED ORDER — POLYETHYLENE GLYCOL 3350 17 G PO PACK
17.0000 g | PACK | Freq: Every day | ORAL | Status: DC | PRN
  Filled 2021-04-30: qty 1

## 2021-04-30 MED ORDER — DEXMEDETOMIDINE (PRECEDEX) IN NS 20 MCG/5ML (4 MCG/ML) IV SYRINGE
PREFILLED_SYRINGE | INTRAVENOUS | Status: DC | PRN
  Administered 2021-04-30: 8 ug via INTRAVENOUS
  Administered 2021-04-30: 12 ug via INTRAVENOUS

## 2021-04-30 MED ORDER — ACETAMINOPHEN 500 MG PO TABS
1000.0000 mg | ORAL_TABLET | Freq: Four times a day (QID) | ORAL | Status: DC | PRN
  Administered 2021-05-01 – 2021-05-02 (×3): 1000 mg via ORAL
  Filled 2021-04-30 (×3): qty 2

## 2021-04-30 MED ORDER — ONDANSETRON 4 MG PO TBDP: 4.0000 mg | ORAL_TABLET | Freq: Four times a day (QID) | ORAL | Status: DC | PRN

## 2021-04-30 MED ORDER — DEXAMETHASONE SODIUM PHOSPHATE 10 MG/ML IJ SOLN
INTRAMUSCULAR | Status: DC | PRN
  Administered 2021-04-30: 10 mg via INTRAVENOUS

## 2021-04-30 MED ORDER — ACETAMINOPHEN 10 MG/ML IV SOLN
INTRAVENOUS | Status: AC
  Filled 2021-04-30: qty 100

## 2021-04-30 MED ORDER — IOHEXOL 350 MG/ML SOLN
80.0000 mL | Freq: Once | INTRAVENOUS | Status: AC | PRN
  Administered 2021-04-30: 80 mL via INTRAVENOUS
  Filled 2021-04-30: qty 80

## 2021-04-30 MED ORDER — SUCCINYLCHOLINE CHLORIDE 200 MG/10ML IV SOSY
PREFILLED_SYRINGE | INTRAVENOUS | Status: AC
  Filled 2021-04-30: qty 10

## 2021-04-30 MED ORDER — SUGAMMADEX SODIUM 200 MG/2ML IV SOLN
INTRAVENOUS | Status: DC | PRN
  Administered 2021-04-30: 200 mg via INTRAVENOUS

## 2021-04-30 MED ORDER — KETOROLAC TROMETHAMINE 30 MG/ML IJ SOLN
30.0000 mg | Freq: Four times a day (QID) | INTRAMUSCULAR | Status: DC
Start: 2021-05-01 — End: 2021-05-02
  Administered 2021-05-01: 30 mg via INTRAVENOUS
  Filled 2021-04-30 (×4): qty 1

## 2021-04-30 MED ORDER — FENTANYL CITRATE (PF) 100 MCG/2ML IJ SOLN: 25.0000 ug | INTRAMUSCULAR | Status: DC | PRN

## 2021-04-30 MED ORDER — PANTOPRAZOLE SODIUM 40 MG IV SOLR
40.0000 mg | Freq: Every day | INTRAVENOUS | Status: DC
  Filled 2021-04-30 (×2): qty 40

## 2021-04-30 MED ORDER — BUPIVACAINE-EPINEPHRINE (PF) 0.5% -1:200000 IJ SOLN
INTRAMUSCULAR | Status: DC | PRN
  Administered 2021-04-30: 30 mL

## 2021-04-30 MED ORDER — PIPERACILLIN-TAZOBACTAM 3.375 G IVPB 30 MIN
3.3750 g | Freq: Once | INTRAVENOUS | Status: AC
  Administered 2021-04-30: 3.375 g via INTRAVENOUS
  Filled 2021-04-30: qty 50

## 2021-04-30 MED ORDER — MIDAZOLAM HCL 2 MG/2ML IJ SOLN
INTRAMUSCULAR | Status: AC
  Filled 2021-04-30: qty 2

## 2021-04-30 MED ORDER — DEXMEDETOMIDINE (PRECEDEX) IN NS 20 MCG/5ML (4 MCG/ML) IV SYRINGE
PREFILLED_SYRINGE | INTRAVENOUS | Status: AC
  Filled 2021-04-30: qty 5

## 2021-04-30 MED ORDER — LACTATED RINGERS IV SOLN: INTRAVENOUS | Status: DC

## 2021-04-30 MED ORDER — LIDOCAINE HCL (CARDIAC) PF 100 MG/5ML IV SOSY
PREFILLED_SYRINGE | INTRAVENOUS | Status: DC | PRN
  Administered 2021-04-30: 100 mg via INTRAVENOUS

## 2021-04-30 MED ORDER — PROMETHAZINE HCL 25 MG/ML IJ SOLN: 6.2500 mg | INTRAMUSCULAR | Status: DC | PRN

## 2021-04-30 MED ORDER — ONDANSETRON HCL 4 MG/2ML IJ SOLN
INTRAMUSCULAR | Status: DC | PRN
  Administered 2021-04-30: 4 mg via INTRAVENOUS

## 2021-04-30 SURGICAL SUPPLY — 53 items
ADH SKN CLS APL DERMABOND .7 (GAUZE/BANDAGES/DRESSINGS) ×1
APL PRP STRL LF DISP 70% ISPRP (MISCELLANEOUS) ×1
BAG SPEC RTRVL LRG 6X4 10 (ENDOMECHANICALS) ×1
BULB RESERV EVAC DRAIN JP 100C (MISCELLANEOUS) ×2 IMPLANT
CANISTER SUCT 1200ML W/VALVE (MISCELLANEOUS) IMPLANT
CHLORAPREP W/TINT 26 (MISCELLANEOUS) ×2 IMPLANT
CUTTER FLEX LINEAR 45M (STAPLE) ×2 IMPLANT
DERMABOND ADVANCED (GAUZE/BANDAGES/DRESSINGS) ×1
DERMABOND ADVANCED .7 DNX12 (GAUZE/BANDAGES/DRESSINGS) ×1 IMPLANT
DRAIN CHANNEL JP 19F (MISCELLANEOUS) ×2 IMPLANT
ELECT CAUTERY BLADE TIP 2.5 (TIP) ×2
ELECT REM PT RETURN 9FT ADLT (ELECTROSURGICAL) ×2
ELECTRODE CAUTERY BLDE TIP 2.5 (TIP) ×1 IMPLANT
ELECTRODE REM PT RTRN 9FT ADLT (ELECTROSURGICAL) ×1 IMPLANT
GAUZE 4X4 16PLY ~~LOC~~+RFID DBL (SPONGE) ×2 IMPLANT
GLOVE SURG SYN 7.0 (GLOVE) ×4 IMPLANT
GLOVE SURG SYN 7.5  E (GLOVE) ×4
GLOVE SURG SYN 7.5 E (GLOVE) ×2 IMPLANT
GOWN STRL REUS W/ TWL LRG LVL3 (GOWN DISPOSABLE) ×2 IMPLANT
GOWN STRL REUS W/TWL LRG LVL3 (GOWN DISPOSABLE) ×4
IRRIGATION STRYKERFLOW (MISCELLANEOUS) ×1 IMPLANT
IRRIGATOR STRYKERFLOW (MISCELLANEOUS) ×2
IV NS 1000ML (IV SOLUTION) ×2
IV NS 1000ML BAXH (IV SOLUTION) ×1 IMPLANT
KIT TURNOVER KIT A (KITS) ×2 IMPLANT
LABEL OR SOLS (LABEL) ×2 IMPLANT
LIGASURE LAP MARYLAND 5MM 37CM (ELECTROSURGICAL) ×2 IMPLANT
MANIFOLD NEPTUNE II (INSTRUMENTS) ×2 IMPLANT
NEEDLE HYPO 22GX1.5 SAFETY (NEEDLE) ×2 IMPLANT
NS IRRIG 500ML POUR BTL (IV SOLUTION) ×2 IMPLANT
PACK LAP CHOLECYSTECTOMY (MISCELLANEOUS) ×2 IMPLANT
PENCIL ELECTRO HAND CTR (MISCELLANEOUS) ×2 IMPLANT
POUCH SPECIMEN RETRIEVAL 10MM (ENDOMECHANICALS) ×2 IMPLANT
RELOAD 45 VASCULAR/THIN (ENDOMECHANICALS) IMPLANT
RELOAD STAPLE TA45 3.5 REG BLU (ENDOMECHANICALS) ×2 IMPLANT
SCISSORS METZENBAUM CVD 33 (INSTRUMENTS) ×2 IMPLANT
SLEEVE ADV FIXATION 5X100MM (TROCAR) ×2 IMPLANT
SUT ETHILON 3-0 FS-10 30 BLK (SUTURE) ×2
SUT MNCRL 4-0 (SUTURE) ×2
SUT MNCRL 4-0 27XMFL (SUTURE) ×1
SUT VIC AB 2-0 SH 27 (SUTURE) ×2
SUT VIC AB 2-0 SH 27XBRD (SUTURE) ×1 IMPLANT
SUT VIC AB 3-0 SH 27 (SUTURE) ×2
SUT VIC AB 3-0 SH 27X BRD (SUTURE) ×1 IMPLANT
SUT VICRYL 0 AB UR-6 (SUTURE) ×4 IMPLANT
SUTURE EHLN 3-0 FS-10 30 BLK (SUTURE) ×1 IMPLANT
SUTURE MNCRL 4-0 27XMF (SUTURE) ×1 IMPLANT
SYS KII FIOS ACCESS ABD 5X100 (TROCAR) ×2
SYSTEM KII FIOS ACES ABD 5X100 (TROCAR) ×1 IMPLANT
TRAY FOLEY MTR SLVR 16FR STAT (SET/KITS/TRAYS/PACK) ×2 IMPLANT
TROCAR BALLN GELPORT 12X130M (ENDOMECHANICALS) ×2 IMPLANT
TUBING EVAC SMOKE HEATED PNEUM (TUBING) ×2 IMPLANT
WATER STERILE IRR 500ML POUR (IV SOLUTION) IMPLANT

## 2021-04-30 NOTE — ED Notes (Signed)
Pts clothing removed and placed in belongings bags. Pt ambulated to the restroom without assistance.

## 2021-04-30 NOTE — Anesthesia Procedure Notes (Signed)
Procedure Name: Intubation Date/Time: 04/30/2021 8:53 PM Performed by: Katherine Basset, CRNA Pre-anesthesia Checklist: Patient identified, Emergency Drugs available, Suction available and Patient being monitored Patient Re-evaluated:Patient Re-evaluated prior to induction Oxygen Delivery Method: Circle system utilized Preoxygenation: Pre-oxygenation with 100% oxygen Induction Type: IV induction and Rapid sequence Ventilation: Mask ventilation without difficulty Laryngoscope Size: Miller and 2 Grade View: Grade I Tube type: Oral Tube size: 7.0 mm Number of attempts: 1 Airway Equipment and Method: Stylet, Oral airway and Bite block Placement Confirmation: ETT inserted through vocal cords under direct vision, positive ETCO2 and breath sounds checked- equal and bilateral Secured at: 21 cm Tube secured with: Tape Dental Injury: Teeth and Oropharynx as per pre-operative assessment

## 2021-04-30 NOTE — Op Note (Signed)
  Procedure Date:  04/30/2021  Pre-operative Diagnosis:  Acute appendicitis  Post-operative Diagnosis: Acute perforated appendicitis; umbilical hernia  Procedure:  Laparoscopic appendectomy; open umbilical hernia repair  Surgeon:  Howie Ill, MD  Anesthesia:  General endotracheal  Estimated Blood Loss:  5 ml  Specimens:  appendix  Complications:  None  Indications for Procedure:  This is a 65 y.o. female who presents with abdominal pain and workup revealing acute appendicitis.  The options of surgery versus observation were reviewed with the patient and/or family. The risks of bleeding, infection, recurrence of symptoms, negative laparoscopy, potential for an open procedure, bowel injury, abscess or infection, were all discussed with the patient and she was willing to proceed.  Description of Procedure: The patient was correctly identified in the preoperative area and brought into the operating room.  The patient was placed supine with VTE prophylaxis in place.  Appropriate time-outs were performed.  Anesthesia was induced and the patient was intubated.  Foley catheter was placed.  Appropriate antibiotics were infused.  The abdomen was prepped and draped in a sterile fashion. An infraumbilical incision was made. Cautery was used to dissect down the subcutaneous tissue along the umbilical stalk.  The stalk was separated from the fascia, revealing a 1 cm umbilical hernia defect.  The fascial edges were cleared using cautery, and a Hasson trocar was inserted.  Pneumoperitoneum was obtained with appropriate opening pressures.  Two 5-mm ports were placed in the suprapubic and left lateral positions under direct visualization.  The right lower quadrant was inspected and the appendix was identified and found to be acutely inflamed and had a small perforation with an adjacent pocket of purulent fluid.  This was suctioned and washed.  The appendix was carefully dissected.  The mesoappendix was  divided using the LigaSure.  The base of the appendix was dissected out and divided with a standard load Endo GIA.  The appendix was placed in an Endocatch bag.  The right lower quadrant was then inspected again revealing an intact staple line, no bleeding, and no bowel injury.  The area was thoroughly irrigated until clear fluid.  Then a 19 Fr. Blake drain was inserted via the left lateral port going to the pelvis and RLQ near the staple line.  The 5 mm ports were removed under direct visualization and the Hasson trocar was removed.  The Endocatch bag was brought out through the umbilical incision.  The umbilical hernia defect was closed using 0 vicryl sutures.  Local anesthetic was infused in all incisions.  The umbilical stalk was reattached to the fascia using 2-0 Vicryl, and the incision was closed in layers using 3-0 Vicryl and 4-0 Monocryl.  The remaining port incisions were closed with 4-0 Monocryl.  The drain was secured to the skin using 3-0 Nylon.  The wounds were cleaned and sealed with DermaBond.  The drain was dressed with 4x4 gauze and TegaDerm.  Foley catheter was removed and the patient was emerged from anesthesia and extubated and brought to the recovery room for further management.  The patient tolerated the procedure well and all counts were correct at the end of the case.   Howie Ill, MD

## 2021-04-30 NOTE — H&P (Signed)
Date of Admission:  04/30/2021  Reason for Admission: Acute appendicitis  History of Present Illness: Alicia Calderon is a 65 y.o. female presenting for evaluation of right lower quadrant abdominal pain.  Patient reports that she has had a history over the past 3 months of intermittent discomfort in the right lower quadrant not associated with meals.  The discomfort is not significant but is something that she feels from time to time.  She had contacted the [facility] 2 days ago to try to get set up for an appointment for evaluation of this.  However yesterday, she started having worsening right lower quadrant pain to a degree that she had never felt before.  She has not felt hungry today she tried to have a boiled egg for breakfast to see if that would help with the pain but it did not.  Denies any nausea or vomiting, fevers or chills at home, diarrhea or constipation.  She is having some chills in the emergency room but she feels that is because of nervousness.  In the emergency room, she had laboratory work-up and a CT scan.  Labs were overall unremarkable except for a white blood cell count of 12.3.  COVID test is negative.  However her CT scan does show a dilated and inflamed appendix in the right lower quadrant with surrounding stranding consistent with acute appendicitis.  I have personally viewed the images and agree with the findings.  I do not see evidence of perforation although there is some irregularity of the wall of the appendix.  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 Authorizing Provider  acetaminophen (TYLENOL) 325 MG tablet Take 650 mg by mouth every 6 (six) hours as needed for moderate pain or headache.    [provider]  Ascorbic Acid (VITAMIN C) 1000 MG tablet Take 1,000 mg by mouth daily.    [provider]  b complex vitamins tablet Take 1 tablet by mouth daily.    [provider]  Calcium-Magnesium 500-250 MG TABS Take 1 tablet by mouth daily at 6 (six) AM.    [provider]  celecoxib (CELEBREX) 200 MG capsule Take 1 capsule (200 mg total) by mouth 2 (two) times daily. 10/28/19   Madelyn FlavorsSmith, Benjamin B, PA-C  cholecalciferol (VITAMIN D3) 25 MCG (1000 UT) tablet Take 2,000 Units by mouth daily.    [provider]  docusate sodium (COLACE) 50 MG capsule Take 50 mg by mouth at bedtime as needed for mild constipation.    [provider]  enoxaparin (LOVENOX) 40 MG/0.4ML injection Inject 0.4 mLs (40 mg total) into the skin daily for 14 days. 10/28/19 11/11/19  Madelyn FlavorsSmith, Benjamin B,  PA-C  oxyCODONE (OXY IR/ROXICODONE) 5 MG immediate release tablet Take 1 tablet (5 mg total) by mouth every 4 (four) hours as needed for moderate pain (pain score 4-6). 10/28/19   Madelyn Flavors, PA-C  traMADol (ULTRAM) 50 MG tablet Take 1-2 tablets (50-100 mg total) by mouth every 4 (four) hours as needed for moderate pain. 10/28/19   Lasandra Beech B, PA-C  zinc gluconate 50 MG tablet Take 50 mg by mouth daily.    [provider]    Allergies: No Known Allergies  Social History:  reports that she has never smoked. She has never used smokeless tobacco. She reports that she does not  currently use alcohol. She reports that she does not currently use drugs.   Family History: History reviewed. No pertinent family history.  Review of Systems: Review of Systems  Constitutional:  Positive for chills. Negative for fever.  HENT:  Negative for hearing loss.   Respiratory:  Negative for shortness of breath.   Cardiovascular:  Negative for chest pain.  Gastrointestinal:  Positive for abdominal pain. Negative for constipation, diarrhea, nausea and vomiting.  Genitourinary:  Negative for dysuria.  Musculoskeletal:  Negative for myalgias.  Skin:  Negative for rash.  Neurological:  Negative for dizziness.  Psychiatric/Behavioral:  Negative for depression.    Physical Exam BP 126/68   Pulse 97   Temp 99.6 F (37.6 C) (Oral)   Resp 18   Ht 5\' 7"  (1.702 m)   Wt 65.8 kg   SpO2 98%   BMI 22.71 kg/m  CONSTITUTIONAL: No acute distress HEENT:  Normocephalic, atraumatic, extraocular motion intact. NECK: Trachea is midline, and there is no jugular venous distension.  RESPIRATORY:  Normal respiratory effort without pathologic use of accessory muscles. CARDIOVASCULAR: Regular rhythm and rate. GI: The abdomen is soft, nondistended, with tenderness to palpation in the right lower quadrant at McBurney's point consistent with acute appendicitis.  MUSCULOSKELETAL:  Normal muscle strength and tone in all four extremities.  No peripheral edema or cyanosis. SKIN: Skin turgor is normal. There are no pathologic skin lesions.  NEUROLOGIC:  Motor and sensation is grossly normal.  Cranial nerves are grossly intact. PSYCH:  Alert and oriented to person, place and time. Affect is normal.  Laboratory Analysis: Results for orders placed or performed during the hospital encounter of 04/30/21 (from the past 24 hour(s))  Lipase, blood     Status: None   Collection Time: 04/30/21 11:49 AM  Result Value Ref Range   Lipase 36 11 - 51 U/L  Comprehensive metabolic panel     Status: Abnormal    Collection Time: 04/30/21 11:49 AM  Result Value Ref Range   Sodium 138 135 - 145 mmol/L   Potassium 3.9 3.5 - 5.1 mmol/L   Chloride 103 98 - 111 mmol/L   CO2 29 22 - 32 mmol/L   Glucose, Bld 120 (H) 70 - 99 mg/dL   BUN 16 8 - 23 mg/dL   Creatinine, Ser 05/02/21 0.44 - 1.00 mg/dL   Calcium 9.7 8.9 - 9.56 mg/dL   Total Protein 7.2 6.5 - 8.1 g/dL   Albumin 4.3 3.5 - 5.0 g/dL   AST 28 15 - 41 U/L   ALT 23 0 - 44 U/L   Alkaline Phosphatase 57 38 - 126 U/L   Total Bilirubin 0.8 0.3 - 1.2 mg/dL   GFR, Estimated 38.7 >56 mL/min   Anion gap 6 5 - 15  CBC     Status: Abnormal   Collection  Time: 04/30/21 11:49 AM  Result Value Ref Range   WBC 12.3 (H) 4.0 - 10.5 K/uL   RBC 4.43 3.87 - 5.11 MIL/uL   Hemoglobin 13.5 12.0 - 15.0 g/dL   HCT 56.2 56.3 - 89.3 %   MCV 91.0 80.0 - 100.0 fL   MCH 30.5 26.0 - 34.0 pg   MCHC 33.5 30.0 - 36.0 g/dL   RDW 73.4 28.7 - 68.1 %   Platelets 302 150 - 400 K/uL   nRBC 0.0 0.0 - 0.2 %  Urinalysis, Complete w Microscopic     Status: Abnormal   Collection Time: 04/30/21 11:49 AM  Result Value Ref Range   Color, Urine STRAW (A) YELLOW   APPearance CLEAR (A) CLEAR   Specific Gravity, Urine 1.005 1.005 - 1.030   pH 7.0 5.0 - 8.0   Glucose, UA NEGATIVE NEGATIVE mg/dL   Hgb urine dipstick NEGATIVE NEGATIVE   Bilirubin Urine NEGATIVE NEGATIVE   Ketones, ur NEGATIVE NEGATIVE mg/dL   Protein, ur NEGATIVE NEGATIVE mg/dL   Nitrite NEGATIVE NEGATIVE   Leukocytes,Ua NEGATIVE NEGATIVE   WBC, UA 0-5 0 - 5 WBC/hpf   Bacteria, UA NONE SEEN NONE SEEN   Squamous Epithelial / LPF NONE SEEN 0 - 5   Mucus PRESENT     Imaging: CT ABDOMEN PELVIS W CONTRAST  Result Date: 04/30/2021 CLINICAL DATA:  Abdominal pain with fever and right lower quadrant pain. EXAM: CT ABDOMEN AND PELVIS WITH CONTRAST TECHNIQUE: Multidetector CT imaging of the abdomen and pelvis was performed using the standard protocol following bolus administration of intravenous contrast. CONTRAST:  70mL  OMNIPAQUE IOHEXOL 350 MG/ML SOLN COMPARISON:  None. FINDINGS: Lower chest: No acute abnormality. Hepatobiliary: No focal liver abnormality is seen. No gallstones, gallbladder wall thickening, or biliary dilatation. Pancreas: Unremarkable. No pancreatic ductal dilatation or surrounding inflammatory changes. Spleen: Normal in size without focal abnormality. Adrenals/Urinary Tract: Adrenal glands are unremarkable. Kidneys are normal, without renal calculi, focal lesion, or hydronephrosis. The urinary bladder is limited in evaluation secondary to overlying streak artifact. Stomach/Bowel: Stomach is within normal limits. The appendix is markedly thickened, dilated and inflamed. There is no definite evidence of associated perforation or abscess. No evidence of bowel dilatation. Vascular/Lymphatic: Mild aortic atherosclerosis. No enlarged abdominal or pelvic lymph nodes. Reproductive: The uterus and bilateral adnexa are poorly visualized secondary to overlying streak artifact. Other: No abdominal wall hernia or abnormality. No abdominopelvic ascites. Musculoskeletal: Bilateral total hip replacements are seen with an extensive amount of associated streak artifact and limited evaluation of the adjacent osseous and soft tissue structures. Marked severity degenerative changes are seen within the lower lumbar spine, most prominent at the levels of L3-L4 and L4-L5. IMPRESSION: 1. Acute appendicitis without evidence of associated perforation or abscess. 2. Bilateral total hip replacements. 3. 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 4. Aortic atherosclerosis. Aortic Atherosclerosis (ICD10-I70.0). Electronically Signed   By: Aram Candela M.D.   On: 04/30/2021 18:25    Assessment and Plan: This is a 65 y.o. female with acute appendicitis.  - Discussed with the patient her laboratory findings as well as CT scan findings.  She does have acute  appendicitis and although there is no evidence on the CT scan of perforation, the wall of the appendix does seem irregular in certain locations.  Discussed with her potential options for conservative management with IV antibiotics alone versus surgical management, but I think her appendix is too dilated at  this point to be able to be managed with antibiotics alone.  She agrees with this and would rather proceed with surgical intervention.  Discussed with her the role for laparoscopic appendectomy and reviewed with her the surgery at length including the incisions, the risks of bleeding, infection, and injury to surrounding structures, postoperative care, timing for discharge to home, postop pain and recovery, and she is willing to proceed. - We will take her to the operating room tonight pending anesthesia availability.  In the meantime, she will remain n.p.o., with IV fluid hydration, and will be started on IV Zosyn.  Face-to-face time spent with the patient and care providers was 70 minutes, with more than 50% of the time spent counseling, educating, and coordinating care of the patient.     Howie Ill, MD [facility] Pg:  [phone]

## 2021-04-30 NOTE — Anesthesia Preprocedure Evaluation (Signed)
Anesthesia Evaluation  Patient identified by MRN, date of birth, ID band Patient awake    Reviewed: Allergy & Precautions, NPO status , Patient's Chart, lab work & pertinent test results  History of Anesthesia Complications Negative for: history of anesthetic complications  Airway Mallampati: I  TM Distance: >3 FB Neck ROM: Full    Dental no notable dental hx. (+) Dental Advidsory Given, Teeth Intact   Pulmonary neg pulmonary ROS, neg shortness of breath, neg sleep apnea, neg COPD, neg recent URI,    breath sounds clear to auscultation- rhonchi (-) wheezing      Cardiovascular Exercise Tolerance: Good (-) hypertension(-) angina(-) CAD, (-) Past MI, (-) Cardiac Stents and (-) CABG (-) dysrhythmias  Rhythm:Regular Rate:Normal - Systolic murmurs and - Diastolic murmurs    Neuro/Psych neg Seizures negative neurological ROS  negative psych ROS   GI/Hepatic negative GI ROS, Neg liver ROS,   Endo/Other  negative endocrine ROSneg diabetes  Renal/GU negative Renal ROS     Musculoskeletal  (+) Arthritis ,   Abdominal (+) - obese,   Peds  Hematology negative hematology ROS (+)   Anesthesia Other Findings Past Medical History: No date: Arthritis No date: Colon polyp No date: Medical history non-contributory No date: Osteopenia  Patient presenting with acute appendicitis emergently coming for surgery.  Last ate almonds at 3:00pm.  Plan for RSI.  Reproductive/Obstetrics                             Lab Results  Component Value Date   WBC 12.3 (H) 04/30/2021   HGB 13.5 04/30/2021   HCT 40.3 04/30/2021   MCV 91.0 04/30/2021   PLT 302 04/30/2021    Anesthesia Physical  Anesthesia Plan  ASA: 2 and emergent  Anesthesia Plan: General   Post-op Pain Management:    Induction: Intravenous  PONV Risk Score and Plan: 2 and Ondansetron, Dexamethasone, Midazolam, Promethazine and Treatment may vary  due to age or medical condition  Airway Management Planned: Oral ETT  Additional Equipment:   Intra-op Plan:   Post-operative Plan: Extubation in OR  Informed Consent: I have reviewed the patients History and Physical, chart, labs and discussed the procedure including the risks, benefits and alternatives for the proposed anesthesia with the patient or authorized representative who has indicated his/her understanding and acceptance.     Dental advisory given  Plan Discussed with: CRNA and Anesthesiologist  Anesthesia Plan Comments:         Anesthesia Quick Evaluation

## 2021-04-30 NOTE — ED Triage Notes (Signed)
Pt comes pov with RLQ pain for a couple of months. Felt like pulled muscle at first. Denies n/v/d. Felt like a "bubble" was there sometimes and when she pushed it would go back in.

## 2021-04-30 NOTE — ED Provider Notes (Signed)
[facility] Emergency Department Provider Note  ____________________________________________   Event Date/Time   First MD Initiated Contact with Patient 04/30/21 1651     (approximate)  I have reviewed the triage vital signs and the nursing notes.   HISTORY  Chief Complaint Abdominal Pain    HPI Alicia Calderon is a 65 y.o. female who has a history of arthritis who comes in with right lower quadrant pain.  Patient reports some pain there the past few months and occasionally little bit of bubble though that when she pushes it back in the pain will go away.  This is in her right mid abdomen.  She states that the pain got significantly worse yesterday and she is continue to have pain there.  Denies a bubble being there now.  The pain is constant, nothing makes it better or worse.  Patient did drive here.  Patient reports that she still has her appendix.  She denies any chest pain, shortness of breath.     Past Medical History:  Diagnosis Date   Arthritis    Colon polyp    Medical history non-contributory    Osteopenia     Patient Active Problem List   Diagnosis Date Noted   Hx of total hip arthroplasty, right 10/26/2019   Status post total hip replacement, left 05/07/2019   Primary osteoarthritis of right hip 02/23/2019   Osteopenia 09/05/2018    Past Surgical History:  Procedure Laterality Date   COLONOSCOPY W/ POLYPECTOMY     COLONOSCOPY WITH PROPOFOL  10/2018   JOINT REPLACEMENT Left    hip   TOTAL HIP ARTHROPLASTY Left 05/06/2019   Procedure: TOTAL HIP ARTHROPLASTY;  Surgeon: Donato Heinz, MD;  Location: [facility] ORS;  Service: Orthopedics;  Laterality: Left;   TOTAL HIP ARTHROPLASTY Right 10/26/2019   Procedure: TOTAL HIP ARTHROPLASTY;  Surgeon: Donato Heinz, MD;  Location: [facility] ORS;  Service: Orthopedics;  Laterality: Right;   WISDOM TOOTH EXTRACTION      Prior to Admission medications   Medication Sig Start Date End Date Taking?  Authorizing Provider  acetaminophen (TYLENOL) 325 MG tablet Take 650 mg by mouth every 6 (six) hours as needed for moderate pain or headache.    [provider]  Ascorbic Acid (VITAMIN C) 1000 MG tablet Take 1,000 mg by mouth daily.    [provider]  b complex vitamins tablet Take 1 tablet by mouth daily.    [provider]  Calcium-Magnesium 500-250 MG TABS Take 1 tablet by mouth daily at 6 (six) AM.    [provider]  celecoxib (CELEBREX) 200 MG capsule Take 1 capsule (200 mg total) by mouth 2 (two) times daily. 10/28/19   Madelyn Flavors, PA-C  cholecalciferol (VITAMIN D3) 25 MCG (1000 UT) tablet Take 2,000 Units by mouth daily.    [provider]  docusate sodium (COLACE) 50 MG capsule Take 50 mg by mouth at bedtime as needed for mild constipation.    [provider]  enoxaparin (LOVENOX) 40 MG/0.4ML injection Inject 0.4 mLs (40 mg total) into the skin daily for 14 days. 10/28/19 11/11/19  Lasandra Beech B, PA-C  oxyCODONE (OXY IR/ROXICODONE) 5 MG immediate release tablet Take 1 tablet (5 mg total) by mouth every 4 (four) hours as needed for moderate pain (pain score 4-6). 10/28/19   Madelyn Flavors, PA-C  traMADol (ULTRAM) 50 MG tablet Take 1-2 tablets (50-100 mg total) by mouth every 4 (four) hours as needed for  moderate pain. 10/28/19   Lasandra BeechSmith, Benjamin B, PA-C  zinc gluconate 50 MG tablet Take 50 mg by mouth daily.    [provider]    Allergies Patient has no known allergies.  History reviewed. No pertinent family history.  Social History Social History   Tobacco Use   Smoking status: Never   Smokeless tobacco: Never  Substance Use Topics   Alcohol use: Not Currently   Drug use: Not Currently      Review of Systems Constitutional: No fever/chills Eyes: No visual changes. ENT: No sore throat. Cardiovascular: Denies chest pain. Respiratory: Denies shortness of breath. Gastrointestinal: Abdominal  pain Genitourinary: Negative for dysuria. Musculoskeletal: Negative for back pain. Skin: Negative for rash. Neurological: Negative for headaches, focal weakness or numbness. All other ROS negative ____________________________________________   PHYSICAL EXAM:  VITAL SIGNS: ED Triage Vitals [04/30/21 1147]  Enc Vitals Group     BP (!) 142/89     Pulse Rate 96     Resp 18     Temp 99.6 F (37.6 C)     Temp Source Oral     SpO2 99 %     Weight 145 lb (65.8 kg)     Height 5\' 7"  (1.702 m)     Head Circumference      Peak Flow      Pain Score 8     Pain Loc      Pain Edu?      Excl. in GC?     Constitutional: Alert and oriented. Well appearing and in no acute distress. Eyes: Conjunctivae are normal. EOMI. Head: Atraumatic. Nose: No congestion/rhinnorhea. Mouth/Throat: Mucous membranes are moist.   Neck: No stridor. Trachea Midline. FROM Cardiovascular: Normal rate, regular rhythm. Grossly normal heart sounds.  Good peripheral circulation. Respiratory: Normal respiratory effort.  No retractions. Lungs CTAB. Gastrointestinal: Soft but tender in the right lower abdomen.  No palpable hernias appreciated in the inguinal region no distention. No abdominal bruits.  Musculoskeletal: No lower extremity tenderness nor edema.  No joint effusions. Neurologic:  Normal speech and language. No gross focal neurologic deficits are appreciated.  Skin:  Skin is warm, dry and intact. No rash noted. Psychiatric: Mood and affect are normal. Speech and behavior are normal. GU: Deferred   ____________________________________________   LABS (all labs ordered are listed, but only abnormal results are displayed)  Labs Reviewed  COMPREHENSIVE METABOLIC PANEL - Abnormal; Notable for the following components:      Result Value   Glucose, Bld 120 (*)    All other components within normal limits  CBC - Abnormal; Notable for the following components:   WBC 12.3 (*)    All other components within  normal limits  URINALYSIS, COMPLETE (UACMP) WITH MICROSCOPIC - Abnormal; Notable for the following components:   Color, Urine STRAW (*)    APPearance CLEAR (*)    All other components within normal limits  LIPASE, BLOOD   ____________________________________________  RADIOLOGY   Official radiology report(s): CT ABDOMEN PELVIS W CONTRAST  Result Date: 04/30/2021 CLINICAL DATA:  Abdominal pain with fever and right lower quadrant pain. EXAM: CT ABDOMEN AND PELVIS WITH CONTRAST TECHNIQUE: Multidetector CT imaging of the abdomen and pelvis was performed using the standard protocol following bolus administration of intravenous contrast. CONTRAST:  80mL OMNIPAQUE IOHEXOL 350 MG/ML SOLN COMPARISON:  None. FINDINGS: Lower chest: No acute abnormality. Hepatobiliary: No focal liver abnormality is seen. No gallstones, gallbladder wall thickening, or biliary dilatation. Pancreas: Unremarkable. No pancreatic ductal dilatation or  surrounding inflammatory changes. Spleen: Normal in size without focal abnormality. Adrenals/Urinary Tract: Adrenal glands are unremarkable. Kidneys are normal, without renal calculi, focal lesion, or hydronephrosis. The urinary bladder is limited in evaluation secondary to overlying streak artifact. Stomach/Bowel: Stomach is within normal limits. The appendix is markedly thickened, dilated and inflamed. There is no definite evidence of associated perforation or abscess. No evidence of bowel dilatation. Vascular/Lymphatic: Mild aortic atherosclerosis. No enlarged abdominal or pelvic lymph nodes. Reproductive: The uterus and bilateral adnexa are poorly visualized secondary to overlying streak artifact. Other: No abdominal wall hernia or abnormality. No abdominopelvic ascites. Musculoskeletal: Bilateral total hip replacements are seen with an extensive amount of associated streak artifact and limited evaluation of the adjacent osseous and soft tissue structures. Marked severity degenerative  changes are seen within the lower lumbar spine, most prominent at the levels of L3-L4 and L4-L5. IMPRESSION: 1. Acute appendicitis without evidence of associated perforation or abscess. 2. Bilateral total hip replacements. 3. 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 4. Aortic atherosclerosis. Aortic Atherosclerosis (ICD10-I70.0). Electronically Signed   By: Aram Candela M.D.   On: 04/30/2021 18:25    ____________________________________________   PROCEDURES  Procedure(s) performed (including Critical Care):  Procedures   ____________________________________________   INITIAL IMPRESSION / ASSESSMENT AND PLAN / ED COURSE  KIELE HEAVRIN was evaluated in Emergency Department on 04/30/2021 for the symptoms described in the history of present illness. She was evaluated in the context of the global COVID-19 pandemic, which necessitated consideration that the patient might be at risk for infection with the SARS-CoV-2 virus that causes COVID-19. Institutional protocols and algorithms that pertain to the evaluation of patients at risk for COVID-19 are in a state of rapid change based on information released by regulatory bodies including the CDC and federal and state organizations. These policies and algorithms were followed during the patient's care in the ED.    Patient comes in with right lower quadrant pain upon my examination.  She has an elevated white count and a little bit of a low-grade temperature we will get CT scan to make sure evidence of appendicitis, diverticulitis, obstruction, perforation, abscess or other acute pathology.  I do not feel any obvious hernia on examination.  Patient declining pain medication at this time given she will be driving home if that work-up is negative  Urine without evidence of UTI.  Labs are reassuring.  Slightly elevated white count 12.3  Patient CT scan is concerning for  appendicitis.  Did start dose of Zosyn and Dr. Aleen Campi came down to see patient to admit patient for appendectomy       ____________________________________________   FINAL CLINICAL IMPRESSION(S) / ED DIAGNOSES   Final diagnoses:  Acute appendicitis, unspecified acute appendicitis type      MEDICATIONS GIVEN DURING THIS VISIT:  Medications  piperacillin-tazobactam (ZOSYN) IVPB 3.375 g (3.375 g Intravenous New Bag/Given 04/30/21 1841)  lactated ringers infusion (has no administration in time range)  HYDROmorphone (DILAUDID) injection 0.5 mg (has no administration in time range)  polyethylene glycol (MIRALAX / GLYCOLAX) packet 17 g (has no administration in time range)  ondansetron (ZOFRAN-ODT) disintegrating tablet 4 mg (has no administration in time range)    Or  ondansetron (ZOFRAN) injection 4 mg (has no administration in time range)  pantoprazole (PROTONIX) injection 40 mg (has no administration in time range)  enoxaparin (LOVENOX) injection 40 mg (has no administration in time range)  piperacillin-tazobactam (ZOSYN) IVPB  3.375 g (has no administration in time range)  iohexol (OMNIPAQUE) 350 MG/ML injection 80 mL (80 mLs Intravenous Contrast Given 04/30/21 1744)     ED Discharge Orders     None        Note:  This document was prepared using Dragon voice recognition software and may include unintentional dictation errors.    Concha Se, MD 04/30/21 Mikle Bosworth

## 2021-04-30 NOTE — Transfer of Care (Signed)
Immediate Anesthesia Transfer of Care Note  Patient: Alicia Calderon  Procedure(s) Performed: APPENDECTOMY LAPAROSCOPIC (Abdomen)  Patient Location: PACU  Anesthesia Type:General  Level of Consciousness: awake, alert  and oriented  Airway & Oxygen Therapy: Patient Spontanous Breathing and Patient connected to face mask oxygen  Post-op Assessment: Report given to RN and Post -op Vital signs reviewed and stable  Post vital signs: Reviewed and stable  Last Vitals:  Vitals Value Taken Time  BP 105/61 04/30/21 2240  Temp    Pulse 83 04/30/21 2242  Resp 17 04/30/21 2242  SpO2 96 % 04/30/21 2242  Vitals shown include unvalidated device data.  Last Pain:  Vitals:   04/30/21 1930  TempSrc: Oral  PainSc:          Complications: No notable events documented.

## 2021-05-01 ENCOUNTER — Encounter: Payer: Self-pay | Admitting: Surgery

## 2021-05-01 LAB — CBC
HCT: 38.4 % (ref 36.0–46.0)
Hemoglobin: 12.6 g/dL (ref 12.0–15.0)
MCH: 29.6 pg (ref 26.0–34.0)
MCHC: 32.8 g/dL (ref 30.0–36.0)
MCV: 90.1 fL (ref 80.0–100.0)
Platelets: 279 10*3/uL (ref 150–400)
RBC: 4.26 MIL/uL (ref 3.87–5.11)
RDW: 13.6 % (ref 11.5–15.5)
WBC: 16.7 10*3/uL — ABNORMAL HIGH (ref 4.0–10.5)
nRBC: 0 % (ref 0.0–0.2)

## 2021-05-01 LAB — HIV ANTIBODY (ROUTINE TESTING W REFLEX): HIV Screen 4th Generation wRfx: NONREACTIVE

## 2021-05-01 LAB — BASIC METABOLIC PANEL
Anion gap: 11 (ref 5–15)
BUN: 15 mg/dL (ref 8–23)
CO2: 24 mmol/L (ref 22–32)
Calcium: 9.4 mg/dL (ref 8.9–10.3)
Chloride: 103 mmol/L (ref 98–111)
Creatinine, Ser: 0.85 mg/dL (ref 0.44–1.00)
GFR, Estimated: >60 mL/min (ref >60)
Glucose, Bld: 148 mg/dL — ABNORMAL HIGH (ref 70–99)
Potassium: 4 mmol/L (ref 3.5–5.1)
Sodium: 138 mmol/L (ref 135–145)

## 2021-05-01 LAB — MAGNESIUM: Magnesium: 1.9 mg/dL (ref 1.7–2.4)

## 2021-05-01 MED ORDER — SODIUM CHLORIDE 0.9 % IV SOLN
INTRAVENOUS | Status: DC | PRN
  Administered 2021-05-01: 500 mL via INTRAVENOUS

## 2021-05-01 NOTE — Progress Notes (Signed)
Patient ambulated around the nurses station once.  Patient tolerated well. 

## 2021-05-01 NOTE — Plan of Care (Signed)
Transferred to Room 347 Post LAp. Appy. Resting with eyes closed. Awakens easily for assessment/VS. Assessment VS WNL. Oriented to Room, Fall Risk and Prevention. Pt. V/O.

## 2021-05-01 NOTE — Anesthesia Postprocedure Evaluation (Signed)
Anesthesia Post Note  Patient: Alicia Calderon  Procedure(s) Performed: APPENDECTOMY LAPAROSCOPIC (Abdomen) HERNIA REPAIR UMBILICAL ADULT (Abdomen)  Patient location during evaluation: PACU Anesthesia Type: General Level of consciousness: awake and alert Pain management: pain level controlled Vital Signs Assessment: post-procedure vital signs reviewed and stable Respiratory status: spontaneous breathing, nonlabored ventilation, respiratory function stable and patient connected to nasal cannula oxygen Cardiovascular status: blood pressure returned to baseline and stable Postop Assessment: no apparent nausea or vomiting Anesthetic complications: no   No notable events documented.   Last Vitals:  Vitals:   05/01/21 0014 05/01/21 0357  BP: (!) 100/58 107/67  Pulse: 68 61  Resp: 20 16  Temp: 37.1 C 36.6 C  SpO2: 97%     Last Pain:  Vitals:   05/01/21 0519  TempSrc:   PainSc: 0-No pain                 Lenard Simmer

## 2021-05-01 NOTE — Progress Notes (Signed)
[facility] SURGICAL PROGRESS NOTE  Hospital Day(s): 0.   Post op day(s): 1 Day Post-Op.   Interval History:  Patient seen and examined No acute events or new complaints overnight.  Patient reports she is feeling much better Abdominal pain is resolved No fever, chills, nausea, emesis Leukocytosis is slightly worse this morning; 16.0K  Renal function remains normal; sCr - 0.85; UO - 1.2L No significant electrolyte derangements  Surgical drain output - 15 ccs Continues on Zosyn Diet advanced to soft  Vital signs in last 24 hours: [min-max] current  Temp:  [97.7 F (36.5 C)-99.6 F (37.6 C)] 97.8 F (36.6 C) (08/29 0357) Pulse Rate:  [61-101] 61 (08/29 0357) Resp:  [16-20] 16 (08/29 0357) BP: (100-142)/(57-89) 107/67 (08/29 0357) SpO2:  [96 %-99 %] 97 % (08/29 0014) Weight:  [65.8 kg] 65.8 kg (08/28 1147)     Height: 5\' 7"  (170.2 cm) Weight: 65.8 kg BMI (Calculated): 22.71   Intake/Output last 2 shifts:  08/28 0701 - 08/29 0700 In: 1686 [P.O.:240; I.V.:1250.9; IV Piggyback:195.1] Out: 1225 [Urine:1200; Drains:15; Blood:10]   Physical Exam:  Constitutional: alert, cooperative and no distress  Respiratory: breathing non-labored at rest  Cardiovascular: regular rate and sinus rhythm  Gastrointestinal: Soft, non-tender, non-distended, no rebound/guarding. Surgical drain in place Integumentary: Laparoscopic incisions are CDI with dermabond, no erythema or drainage   Labs:  CBC Latest Ref Rng & Units 05/01/2021 04/30/2021 10/21/2019  WBC 4.0 - 10.5 K/uL 16.7(H) 12.3(H) 8.7  Hemoglobin 12.0 - 15.0 g/dL 10/23/2019 16.1 09.6  Hematocrit 36.0 - 46.0 % 38.4 40.3 38.2  Platelets 150 - 400 K/uL 279 302 337   CMP Latest Ref Rng & Units 05/01/2021 04/30/2021 10/21/2019  Glucose 70 - 99 mg/dL 10/23/2019) 409(W) 119(J)  BUN 8 - 23 mg/dL 15 16 478(G)  Creatinine 0.44 - 1.00 mg/dL 95(A 2.13 0.86  Sodium 135 - 145 mmol/L 138 138 136  Potassium 3.5 - 5.1 mmol/L 4.0 3.9 3.8  Chloride 98  - 111 mmol/L 103 103 102  CO2 22 - 32 mmol/L 24 29 26   Calcium 8.9 - 10.3 mg/dL 9.4 9.7 9.5  Total Protein 6.5 - 8.1 g/dL - 7.2 7.3  Total Bilirubin 0.3 - 1.2 mg/dL - 0.8 0.6  Alkaline Phos 38 - 126 U/L - 57 54  AST 15 - 41 U/L - 28 20  ALT 0 - 44 U/L - 23 20     Imaging studies: No new pertinent imaging studies   Assessment/Plan:  65 y.o. female  1 Day Post-Op s/p laparoscopic appendectomy and umbilical hernia repair for acute appendicitis.   - Okay to continue diet as tolerated  - Continue IV Abx (Zosyn); will need PO for home  - Continue surgical drain; monitor and record output; provide teaching today  - Monitor leukocytosis; morning CBC  - Monitor abdominal examination; on-going bowel function  - Pain control prn; antiemetics prn  - Ambulate as tolerated    - Discharge Planning: She will benefit from additional 24 hours of IV Abx, likely home in AM with drain and PO Abx.   All of the above findings and recommendations were discussed with the patient, and the medical team, and all of patient's questions were answered to her expressed satisfaction.  -- , PA-C [facility] 05/01/2021, 7:32 AM [phone] M-F: 7am - 4pm

## 2021-05-01 NOTE — Progress Notes (Signed)
Patient ambulated around the nurses station once.  Patient tolerated well.

## 2021-05-01 NOTE — Discharge Instructions (Signed)
In addition to included general post-operative instructions,  Diet: Resume home diet.   Activity: No heavy lifting >20 pounds (children, pets, laundry, garbage) or strenuous activity until follow-up in 2 weeks, but light activity and walking are encouraged. Do not drive or drink alcohol if taking narcotic pain medications or having pain that might distract from driving.  Wound care: If you can keep drain site water proofed, you may shower/get incision wet with soapy water and pary (do not rub incisions), but no baths or submerging incision underwater until follow-up.   Medications: Resume all home medications. For mild to moderate pain: acetaminophen (Tylenol) or ibuprofen/naproxen (if no kidney disease). Combining Tylenol with alcohol can substantially increase your risk of causing liver disease. Narcotic pain medications, if prescribed, can be used for severe pain, though may cause nausea, constipation, and drowsiness. Do not combine Tylenol and Percocet (or similar) within a 6 hour period as Percocet (and similar) contain(s) Tylenol. If you do not need the narcotic pain medication, you do not need to fill the prescription.  Call office [phone] / [phone]) at any time if any questions, worsening pain, fevers/chills, bleeding, drainage from incision site, or other concerns.

## 2021-05-02 LAB — CBC
HCT: 35.2 % — ABNORMAL LOW (ref 36.0–46.0)
Hemoglobin: 11.8 g/dL — ABNORMAL LOW (ref 12.0–15.0)
MCH: 30.7 pg (ref 26.0–34.0)
MCHC: 33.5 g/dL (ref 30.0–36.0)
MCV: 91.7 fL (ref 80.0–100.0)
Platelets: 262 10*3/uL (ref 150–400)
RBC: 3.84 MIL/uL — ABNORMAL LOW (ref 3.87–5.11)
RDW: 13.5 % (ref 11.5–15.5)
WBC: 10.7 10*3/uL — ABNORMAL HIGH (ref 4.0–10.5)
nRBC: 0 % (ref 0.0–0.2)

## 2021-05-02 LAB — SURGICAL PATHOLOGY

## 2021-05-02 MED ORDER — IBUPROFEN 600 MG PO TABS: 600.0000 mg | ORAL_TABLET | Freq: Four times a day (QID) | ORAL | 0 refills | Status: AC | PRN

## 2021-05-02 MED ORDER — AMOXICILLIN-POT CLAVULANATE 875-125 MG PO TABS: 1.0000 | ORAL_TABLET | Freq: Two times a day (BID) | ORAL | 0 refills | Status: AC

## 2021-05-02 NOTE — Progress Notes (Signed)
Patient discharged, discharge instructions given to patient.  Patient instructed on how to empty JP drain and record.  Patient aware of appointment on Friday.  Transported by auxillary.

## 2021-05-02 NOTE — Discharge Summary (Signed)
[facility] SUR[facility]ICAL DISCHARGE SUMMARY   Patient ID: Alicia Calderon MRN: [id number] DOB/AGE: [birth year] 65 y.o.  Admit date: 04/30/2021 Discharge date: 05/02/2021  Discharge Diagnoses Patient Active Problem List   Diagnosis Date Noted   Acute appendicitis 04/30/2021   Umbilical hernia without obstruction and without gangrene     Consultants None  Procedures 04/30/2021:  Laparoscopic Appendectomy and Umbilical Hernia Repair    HPI: Alicia Calderon is a 65 y.o. female presenting for evaluation of right lower quadrant abdominal pain.  Patient reports that she has had a history over the past 3 months of intermittent discomfort in the right lower quadrant not associated with meals.  The discomfort is not significant but is something that she feels from time to time.  She had contacted the [place] clinic 2 days ago to try to get set up for an appointment for evaluation of this.  However yesterday, she started having worsening right lower quadrant pain to a degree that she had never felt before.  She has not felt hungry today she tried to have a boiled egg for breakfast to see if that would help with the pain but it did not.  Denies any nausea or vomiting, fevers or chills at home, diarrhea or constipation.  She is having some chills in the emergency room but she feels that is because of nervousness.  In the emergency room, she had laboratory work-up and a CT scan.  Labs were overall unremarkable except for a white blood cell count of 12.3.  COVID test is negative.  However her CT scan does show a dilated and inflamed appendix in the right lower quadrant with surrounding stranding consistent with acute appendicitis.  I have personally viewed the images and agree with the findings.  I do not see evidence of perforation although there is some irregularity of the wall of the appendix.  Hospital Course: Informed consent was obtained and documented, and patient underwent uneventful  laparoscopic appendectomy and umbilical hernia repair (Dr Aleen Campi, 05/02/2021).  Post-operatively, patient's pain and symptoms improved/resolved and advancement of patient's diet and ambulation were well-tolerated. She stayed an additional 24 hours for IV Abx. The remainder of patient's hospital course was essentially unremarkable, and discharge planning was initiated accordingly with patient safely able to be discharged home with appropriate discharge instructions, antibiotics (Augmentin x7 days), pain control, and outpatient follow-up after all of her questions were answered to her expressed satisfaction.   Discharge Condition: Good   Physical Examination:  Constitutional: alert, cooperative and no distress  Respiratory: breathing non-labored at rest  Cardiovascular: regular rate and sinus rhythm  Gastrointestinal: Soft, non-tender, non-distended, no rebound/guarding. Surgical drain in place, LLQ, serosanguinous  Integumentary: Laparoscopic incisions are CDI with dermabond, no erythema or drainage    Allergies as of 05/02/2021   No Known Allergies      Medication List     TAKE these medications    acetaminophen 325 MG tablet Commonly known as: TYLENOL Take 650 mg by mouth every 6 (six) hours as needed for moderate pain or headache.   amoxicillin-clavulanate 875-125 MG tablet Commonly known as: Augmentin Take 1 tablet by mouth 2 (two) times daily for 7 days.   b complex vitamins tablet Take 1 tablet by mouth daily.   Calcium-Magnesium 500-250 MG Tabs Take 1 tablet by mouth daily at 6 (six) AM.   celecoxib 200 MG capsule Commonly known as: CELEBREX Take 1 capsule (200 mg total) by mouth 2 (two) times daily.   cholecalciferol 25  MCG (1000 UNIT) tablet Commonly known as: VITAMIN D3 Take 2,000 Units by mouth daily.   docusate sodium 50 MG capsule Commonly known as: COLACE Take 50 mg by mouth at bedtime as needed for mild constipation.   enoxaparin 40 MG/0.4ML  injection Commonly known as: LOVENOX Inject 0.4 mLs (40 mg total) into the skin daily for 14 days.   ibuprofen 600 MG tablet Commonly known as: ADVIL Take 1 tablet (600 mg total) by mouth every 6 (six) hours as needed.   oxyCODONE 5 MG immediate release tablet Commonly known as: Oxy IR/ROXICODONE Take 1 tablet (5 mg total) by mouth every 4 (four) hours as needed for moderate pain (pain score 4-6).   traMADol 50 MG tablet Commonly known as: ULTRAM Take 1-2 tablets (50-100 mg total) by mouth every 4 (four) hours as needed for moderate pain.   vitamin C 1000 MG tablet Take 1,000 mg by mouth daily.   zinc gluconate 50 MG tablet Take 50 mg by mouth daily.          Follow-up Information     Henrene Dodge, MD. Go on 05/05/2021.   Specialty: General Surgery Why: s/p laparoscopic appendectomy, has drain Contact information: [address] [place] [address] [phone]                  Time spent on discharge management including discussion of hospital course, clinical condition, outpatient instructions, prescriptions, and follow up with the patient and members of the medical team: >30 minutes  -- Lynden Oxford , PA-C [facility]  05/02/2021, 7:41 AM [phone] M-F: 7am - 4pm

## 2021-05-05 ENCOUNTER — Ambulatory Visit (INDEPENDENT_AMBULATORY_CARE_PROVIDER_SITE_OTHER): Payer: BC Managed Care – PPO | Admitting: Surgery

## 2021-05-05 ENCOUNTER — Encounter: Payer: Self-pay | Admitting: Surgery

## 2021-05-05 ENCOUNTER — Other Ambulatory Visit: Payer: Self-pay

## 2021-05-05 VITALS — BP 148/101 | HR 105 | Temp 99.1°F | Ht 67.0 in | Wt 146.2 lb

## 2021-05-05 DIAGNOSIS — K3533 Acute appendicitis with perforation and localized peritonitis, with abscess: Secondary | ICD-10-CM

## 2021-05-05 DIAGNOSIS — K429 Umbilical hernia without obstruction or gangrene: Secondary | ICD-10-CM

## 2021-05-05 NOTE — Progress Notes (Signed)
05/05/2021  HPI: Alicia Calderon is a 65 y.o. female s/p laparoscopic appendectomy and open umbilical hernia repair for perforated appendicitis with a small pocket of purulent fluid on 04/30/2021.  19 [place] drain was left in place.  Patient was discharged on 8/30 on a course of Augmentin and presents today for follow-up.  She reports that yesterday she has had issues with nausea, vomiting, and diarrhea and she feels thirsty today and also weaker.  The drain has been emptying serous fluid she has been emptying the bulb multiple times a day.  Vital signs: BP (!) 148/101   Pulse (!) 105   Temp 99.1 F (37.3 C) (Oral)   Ht 5\' 7"  (1.702 m)   Wt 146 lb 3.2 oz (66.3 kg)   SpO2 96%   BMI 22.90 kg/m    Physical Exam: Constitutional: No acute distress Abdomen: Soft, nondistended, with appropriate tenderness to palpation.  Umbilical incision is healing well with some mild ecchymosis.  Incisions are also healing well without any evidence of infection.  Drain site is also looking healthy with serous fluid in the bulb.  This was emptied at bedside.  Dry gauze dressing applied around the drain.  Assessment/Plan: This is a 65 y.o. female s/p laparoscopic appendectomy and open umbilical hernia repair.  - Discussed with patient that she may have a gastroenteritis episode.  I do not think this is related to the antibiotics or complication of the surgery at this point.  However discussed with the patient that if she were not to improve over the next 24 to 48 hours, she should come to the hospital for further evaluation.  In the meantime, encouraged her to stay hydrated and to start taking Gatorade or Pedialyte to also help with electrolyte depletion.  Also encouraged her to increase her protein intake to help with wound healing and the amount of serous fluid. - Follow-up next week for possible drain removal.   78, MD [facility]

## 2021-05-05 NOTE — Patient Instructions (Signed)
Please keep recording the fluid output and bring it to your visits.  If you have any concerns or questions, please feel free to call our office. See follow up appointment below.   Dehydration, Adult Dehydration is condition in which there is not enough water or other fluids in the body. This happens when a person loses more fluids than he or she takes in. Important body parts cannot work right without the right amount of fluids. Any loss of fluids from the body can cause dehydration. Dehydration can be mild, worse, or very bad. It should be treated right away to keep it from getting very bad. What are the causes? This condition may be caused by: Conditions that cause loss of water or other fluids, such as: Watery poop (diarrhea). Vomiting. Sweating a lot. Peeing (urinating) a lot. Not drinking enough fluids, especially when you: Are ill. Are doing things that take a lot of energy to do. Other illnesses and conditions, such as fever or infection. Certain medicines, such as medicines that take extra fluid out of the body (diuretics). Lack of safe drinking water. Not being able to get enough water and food. What increases the risk? The following factors may make you more likely to develop this condition: Having a long-term (chronic) illness that has not been treated the right way, such as: Diabetes. Heart disease. Kidney disease. Being 40 years of age or older. Having a disability. Living in a place that is high above the ground or sea (high in altitude). The thinner, dried air causes more fluid loss. Doing exercises that put stress on your body for a long time. What are the signs or symptoms? Symptoms of dehydration depend on how bad it is. Mild or worse dehydration Thirst. Dry lips or dry mouth. Feeling dizzy or light-headed, especially when you stand up from sitting. Muscle cramps. Your body making: Dark pee (urine). Pee may be the color of tea. Less pee than normal. Less  tears than normal. Headache. Very bad dehydration Changes in skin. Skin may: Be cold to the touch (clammy). Be blotchy or pale. Not go back to normal right after you lightly pinch it and let it go. Little or no tears, pee, or sweat. Changes in vital signs, such as: Fast breathing. Low blood pressure. Weak pulse. Pulse that is more than 100 beats a minute when you are sitting still. Other changes, such as: Feeling very thirsty. Eyes that look hollow (sunken). Cold hands and feet. Being mixed up (confused). Being very tired (lethargic) or having trouble waking from sleep. Short-term weight loss. Loss of consciousness. How is this treated? Treatment for this condition depends on how bad it is. Treatment should start right away. Do not wait until your condition gets very bad. Very bad dehydration is an emergency. You will need to go to a hospital. Mild or worse dehydration can be treated at home. You may be asked to: Drink more fluids. Drink an oral rehydration solution (ORS). This drink helps get the right amounts of fluids and salts and minerals in the blood (electrolytes). Very bad dehydration can be treated: With fluids through an IV tube. By getting normal levels of salts and minerals in your blood. This is often done by giving salts and minerals through a tube. The tube is passed through your nose and into your stomach. By treating the root cause. Follow these instructions at home: Oral rehydration solution If told by your doctor, drink an ORS: Make an ORS. Use instructions on the package.  Start by drinking small amounts, about  cup (120 mL) every 5-10 minutes. Slowly drink more until you have had the amount that your doctor said to have. Eating and drinking     Drink enough clear fluid to keep your pee pale yellow. If you were told to drink an ORS, finish the ORS first. Then, start slowly drinking other clear fluids. Drink fluids such as: Water. Do not drink only water.  Doing that can make the salt (sodium) level in your body get too low. Water from ice chips you suck on. Fruit juice that you have added water to (diluted). Low-calorie sports drinks. Eat foods that have the right amounts of salts and minerals, such as: Bananas. Oranges. Potatoes. Tomatoes. Spinach. Do not drink alcohol. Avoid: Drinks that have a lot of sugar. These include: High-calorie sports drinks. Fruit juice that you did not add water to. Soda. Caffeine. Foods that are greasy or have a lot of fat or sugar. General instructions Take over-the-counter and prescription medicines only as told by your doctor. Do not take salt tablets. Doing that can make the salt level in your body get too high. Return to your normal activities as told by your doctor. Ask your doctor what activities are safe for you. Keep all follow-up visits as told by your doctor. This is important. Contact a doctor if: You have pain in your belly (abdomen) and the pain: Gets worse. Stays in one place. You have a rash. You have a stiff neck. You get angry or annoyed (irritable) more easily than normal. You are more tired or have a harder time waking than normal. You feel: Weak or dizzy. Very thirsty. Get help right away if you have: Any symptoms of very bad dehydration. Symptoms of vomiting, such as: You cannot eat or drink without vomiting. Your vomiting gets worse or does not go away. Your vomit has blood or green stuff in it. Symptoms that get worse with treatment. A fever. A very bad headache. Problems with peeing or pooping (having a bowel movement), such as: Watery poop that gets worse or does not go away. Blood in your poop (stool). This may cause poop to look black and tarry. Not peeing in 6-8 hours. Peeing only a small amount of very dark pee in 6-8 hours. Trouble breathing. These symptoms may be an emergency. Do not wait to see if the symptoms will go away. Get medical help right away. Call  your local emergency services (911 in the [place] Do not drive yourself to the hospital. Summary Dehydration is a condition in which there is not enough water or other fluids in the body. This happens when a person loses more fluids than he or she takes in. Treatment for this condition depends on how bad it is. Treatment should be started right away. Do not wait until your condition gets very bad. Drink enough clear fluid to keep your pee pale yellow. If you were told to drink an oral rehydration solution (ORS), finish the ORS first. Then, start slowly drinking other clear fluids. Take over-the-counter and prescription medicines only as told by your doctor. Get help right away if you have any symptoms of very bad dehydration. This information is not intended to replace advice given to you by your health care provider. Make sure you discuss any questions you have with your health care provider. Document Revised: 04/02/2019 Document Reviewed: 04/02/2019 Elsevier Patient Education  2022 [employer].

## 2021-05-10 ENCOUNTER — Ambulatory Visit (INDEPENDENT_AMBULATORY_CARE_PROVIDER_SITE_OTHER): Payer: BC Managed Care – PPO | Admitting: Surgery

## 2021-05-10 ENCOUNTER — Other Ambulatory Visit: Payer: Self-pay

## 2021-05-10 ENCOUNTER — Encounter: Payer: Self-pay | Admitting: Surgery

## 2021-05-10 VITALS — BP 140/84 | HR 102 | Temp 98.4°F | Ht 67.0 in | Wt 143.2 lb

## 2021-05-10 DIAGNOSIS — K429 Umbilical hernia without obstruction or gangrene: Secondary | ICD-10-CM

## 2021-05-10 DIAGNOSIS — K3533 Acute appendicitis with perforation and localized peritonitis, with abscess: Secondary | ICD-10-CM

## 2021-05-10 DIAGNOSIS — Z09 Encounter for follow-up examination after completed treatment for conditions other than malignant neoplasm: Secondary | ICD-10-CM

## 2021-05-10 NOTE — Patient Instructions (Signed)
We removed the drain today. You will need to keep a dressing over the drain site until it heals completely. You may resume to taking showers. You may wash the wound with soap and water and rinse well.   GENERAL POST-OPERATIVE PATIENT INSTRUCTIONS   WOUND CARE INSTRUCTIONS:  Keep a dry clean dressing on the wound if there is drainage. The initial bandage may be removed after 24 hours.  Once the wound has quit draining you may leave it open to air.  If clothing rubs against the wound or causes irritation and the wound is not draining you may cover it with a dry dressing during the daytime.  Try to keep the wound dry and avoid ointments on the wound unless directed to do so.  If the wound becomes bright red and painful or starts to drain infected material that is not clear, please contact your physician immediately.  If the wound is mildly pink and has a thick firm ridge underneath it, this is normal, and is referred to as a healing ridge.  This will resolve over the next 4-6 weeks.  BATHING: You may shower if you have been informed of this by your surgeon. However, Please do not submerge in a tub, hot tub, or pool until incisions are completely sealed or have been told by your surgeon that you may do so.  DIET:  You may eat any foods that you can tolerate.  It is a good idea to eat a high fiber diet and take in plenty of fluids to prevent constipation.  If you do become constipated you may want to take a mild laxative or take ducolax tablets on a daily basis until your bowel habits are regular.  Constipation can be very uncomfortable, along with straining, after recent surgery.  ACTIVITY:  You are encouraged to cough and deep breath or use your incentive spirometer if you were given one, every 15-30 minutes when awake.  This will help prevent respiratory complications and low grade fevers post-operatively if you had a general anesthetic.  You may want to hug a pillow when coughing and sneezing to add  additional support to the surgical area, if you had abdominal or chest surgery, which will decrease pain during these times.  You are encouraged to walk and engage in light activity for the next two weeks.  You should not lift more than 20 pounds, until 05/28/2021 as it could put you at increased risk for complications.  Twenty pounds is roughly equivalent to a plastic bag of groceries. At that time- Listen to your body when lifting, if you have pain when lifting, stop and then try again in a few days. Soreness after doing exercises or activities of daily living is normal as you get back in to your normal routine.  MEDICATIONS:  Try to take narcotic medications and anti-inflammatory medications, such as tylenol, ibuprofen, naprosyn, etc., with food.  This will minimize stomach upset from the medication.  Should you develop nausea and vomiting from the pain medication, or develop a rash, please discontinue the medication and contact your physician.  You should not drive, make important decisions, or operate machinery when taking narcotic pain medication.  SUNBLOCK Use sun block to incision area over the next year if this area will be exposed to sun. This helps decrease scarring and will allow you avoid a permanent darkened area over your incision.  QUESTIONS:  Please feel free to call our office if you have any questions, and we will be  glad to assist you. [phone]

## 2021-05-10 NOTE — Progress Notes (Signed)
05/10/2021  HPI: Alicia Calderon is a 65 y.o. female s/p laparoscopic appendectomy for perforated appendicitis and open umbilical hernia repair on 04/30/21.  She was seen in office on 05/05/21, but her drain was still having high output and was kept in place.  Today, she reports that she's getting better from the gastroenteritis episode she had and was able to maintain hydration and now her bowel movements are returning to normal.  The drain output has also been improving and has been decreasing to about 0.5 to 1 ounce per day.  Vital signs: BP 140/84   Pulse (!) 102   Temp 98.4 F (36.9 C) (Oral)   Ht 5\' 7"  (1.702 m)   Wt 143 lb 3.2 oz (65 kg)   SpO2 97%   BMI 22.43 kg/m    Physical Exam: Constitutional:  No acute distress Abdomen:  soft, non-distended, appropriately sore to palpation.  Drain with serous fluid.  Removed at bedside without complication.  Dry gauze dressing applied.  Incisions are healing well, clean, dry, intact.  Assessment/Plan: This is a 65 y.o. female s/p laparoscopic appendectomy and open umbilical hernia repair.  --Drain removed today at bedside without issues. --Patient may shower, and change dressing once daily until drain site is fully healed. --Complete antibiotic course - no further rx needed after that. --No heavy lifting/pushing for 4 weeks. --Follow up as needed.   78, MD [facility]

## 2023-01-13 ENCOUNTER — Ambulatory Visit
Admission: EM | Admit: 2023-01-13 | Discharge: 2023-01-13 | Disposition: A | Discharge status: Home / Self Care | Payer: Medicare Other | Attending: Urgent Care | Admitting: Urgent Care

## 2023-01-13 DIAGNOSIS — J029 Acute pharyngitis, unspecified: Secondary | ICD-10-CM

## 2023-01-13 DIAGNOSIS — R051 Acute cough: Secondary | ICD-10-CM

## 2023-01-13 LAB — POCT RAPID STREP A (OFFICE): Rapid Strep A Screen: NEGATIVE

## 2023-01-13 MED ORDER — BENZONATATE 100 MG PO CAPS
ORAL_CAPSULE | ORAL | 0 refills | Status: AC
Start: 2023-01-13 — End: ?

## 2023-01-13 NOTE — Discharge Instructions (Addendum)
Given the short duration of your symptoms, the most likely cause is a viral infection.  As we discussed, antibiotics are not effective against this type of infection and improper use of antibiotics can result in development of multidrug resistant infections.  I have prescribed a cough suppressant to be used during the day.  You can also use over-the-counter cough suppressant such as dextromethorphan which is the active ingredient in most over-the-counter cough medicines.  Benzonatate can be used both daytime and nighttime.  It will not cause sedation and should not keep you awake.  I also recommend you start using guaifenesin (Mucinex) to help thin your mucus secretions and make your cough more effective.  Follow up here or with your primary care provider if your symptoms are worsening or not improving.

## 2023-01-13 NOTE — ED Provider Notes (Signed)
Renaldo Fiddler    CSN: [id number] Arrival date & time: 01/13/23  7829      History   Chief Complaint No chief complaint on file.   HPI Alicia Calderon is a 67 y.o. female.   HPI  Presents to urgent care with complaint of sore throat, headache, nasal congestion, cough.  Symptoms x 4 days.  Symptoms have been treated with OTC analgesics and allergy meds.  Patient endorses copious mucus production and she states she feels as if her throat is "on fire".  Reports very painful swallow.  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   Primary osteoarthritis of right hip 02/23/2019   Osteopenia 09/05/2018    Past Surgical History:  Procedure Laterality Date   COLONOSCOPY W/ POLYPECTOMY     COLONOSCOPY WITH PROPOFOL  10/2018   JOINT REPLACEMENT Left    hip   LAPAROSCOPIC APPENDECTOMY N/A 04/30/2021   Procedure: APPENDECTOMY LAPAROSCOPIC;  Surgeon: Henrene Dodge, MD;  Location: [facility] ORS;  Service: General;  Laterality: N/A;   TOTAL HIP ARTHROPLASTY Left 05/06/2019   Procedure: TOTAL HIP ARTHROPLASTY;  Surgeon: Donato Heinz, MD;  Location: [facility] ORS;  Service: Orthopedics;  Laterality: Left;   TOTAL HIP ARTHROPLASTY Right 10/26/2019   Procedure: TOTAL HIP ARTHROPLASTY;  Surgeon: Donato Heinz, MD;  Location: [facility] ORS;  Service: Orthopedics;  Laterality: Right;   UMBILICAL HERNIA REPAIR N/A 04/30/2021   Procedure: HERNIA REPAIR UMBILICAL ADULT;  Surgeon: Henrene Dodge, MD;  Location: [facility] ORS;  Service: General;  Laterality: N/A;   WISDOM TOOTH EXTRACTION      OB History   No obstetric history on file.      Home Medications    Prior to  Admission medications   Medication Sig Start Date End Date Taking? Authorizing Provider  acetaminophen (TYLENOL) 325 MG tablet Take 650 mg by mouth every 6 (six) hours as needed for moderate pain or headache.    [provider]  Ascorbic Acid (VITAMIN C) 1000 MG tablet Take 1,000 mg by mouth daily.    [provider]  b complex vitamins tablet Take 1 tablet by mouth daily.    [provider]  Calcium-Magnesium 500-250 MG TABS Take 1 tablet by mouth daily at 6 (six) AM.    [provider]  celecoxib (CELEBREX) 200 MG capsule Take 1 capsule (200 mg total) by mouth 2 (two) times daily. 10/28/19   Madelyn Flavors, PA-C  cholecalciferol (VITAMIN D3) 25 MCG (1000 UT) tablet Take 2,000 Units by mouth daily.    [provider]  docusate sodium (COLACE) 50 MG capsule Take 50 mg by mouth at bedtime as needed for mild constipation.    [provider]  enoxaparin (LOVENOX) 40 MG/0.4ML injection Inject 0.4 mLs (40 mg total) into the skin daily for 14 days. 10/28/19 11/11/19  Lasandra Beech B, PA-C  ibuprofen (ADVIL) 600 MG tablet Take 1 tablet (600 mg total) by mouth every 6 (six) hours as needed. 05/02/21   Donovan Kail, PA-C  traMADol (ULTRAM) 50 MG tablet Take 1-2 tablets (50-100 mg total) by mouth every 4 (four) hours as  needed for moderate pain. 10/28/19   Lasandra Beech B, PA-C  zinc gluconate 50 MG tablet Take 50 mg by mouth daily.    [provider]    Family History No family history on file.  Social History Social History   Tobacco Use   Smoking status: Never   Smokeless tobacco: Never  Substance Use Topics   Alcohol use: Not Currently   Drug use: Not Currently     Allergies   Patient has no allergy information on record.   Review of Systems Review of Systems   Physical Exam Triage Vital Signs ED Triage Vitals [01/13/23 1106]  Enc Vitals Group     BP (!) 142/85     Pulse Rate 92     Resp 16     Temp 100.1 F  (37.8 C)     Temp Source Oral     SpO2 93 %     Weight      Height      Head Circumference      Peak Flow      Pain Score      Pain Loc      Pain Edu?      Excl. in GC?    No data found.  Updated Vital Signs BP (!) 142/85 (BP Location: Left Arm)   Pulse 92   Temp 100.1 F (37.8 C) (Oral)   Resp 16   SpO2 93%   Visual Acuity Right Eye Distance:   Left Eye Distance:   Bilateral Distance:    Right Eye Near:   Left Eye Near:    Bilateral Near:     Physical Exam Vitals reviewed.  Constitutional:      Appearance: Normal appearance. She is ill-appearing.  HENT:     Mouth/Throat:     Pharynx: Posterior oropharyngeal erythema present. No oropharyngeal exudate.  Cardiovascular:     Rate and Rhythm: Normal rate and regular rhythm.     Pulses: Normal pulses.     Heart sounds: Normal heart sounds.  Pulmonary:     Effort: Pulmonary effort is normal.     Breath sounds: Normal breath sounds.  Skin:    General: Skin is warm and dry.  Neurological:     General: No focal deficit present.     Mental Status: She is alert and oriented to person, place, and time.  Psychiatric:        Mood and Affect: Mood normal.        Behavior: Behavior normal.      UC Treatments / Results  Labs (all labs ordered are listed, but only abnormal results are displayed) Labs Reviewed - No data to display  EKG   Radiology No results found.  Procedures Procedures (including critical care time)  Medications Ordered in UC Medications - No data to display  Initial Impression / Assessment and Plan / UC Course  I have reviewed the triage vital signs and the nursing notes.  Pertinent labs & imaging results that were available during my care of the patient were reviewed by me and considered in my medical decision making (see chart for details).   Alicia Calderon is a 67 y.o. female presenting with URI symptoms with sore throat and cough. Patient has elevated temp without recent  antipyretics, satting well on room air. Overall is ill appearing though non-toxic, well hydrated, without respiratory distress. Pulmonary exam is unremarkable.  Lungs CTAB without wheezing, rhonchi, rales. RRR.  Very mild pharyngeal erythema.  No  peritonsillar exudates though thick mucus is present in her posterior pharynx.  Rapid strep result is negative.  Recommending supportive care and continued use of OTC medication for symptom control.  Will prescribe benzonatate for relief of cough.  Reviewed chart history.   Counseled patient on potential for adverse effects with medications prescribed/recommended today, ER and return-to-clinic precautions discussed, patient verbalized understanding and agreement with care plan.   Final Clinical Impressions(s) / UC Diagnoses   Final diagnoses:  None   Discharge Instructions   None    ED Prescriptions   None    PDMP not reviewed this encounter.   Charma Igo, [place] 01/13/23 1206

## 2023-01-13 NOTE — ED Triage Notes (Signed)
Patient presents to UC for sore throat, HA, nasal congestion, and cough since weds. Treating symptoms with tylenol and allergy meds.

## 2024-07-13 ENCOUNTER — Ambulatory Visit

## 2024-07-13 DIAGNOSIS — Z860101 Personal history of adenomatous and serrated colon polyps: Secondary | ICD-10-CM | POA: Diagnosis not present

## 2024-07-13 DIAGNOSIS — Z09 Encounter for follow-up examination after completed treatment for conditions other than malignant neoplasm: Secondary | ICD-10-CM | POA: Diagnosis present

## 2024-07-13 DIAGNOSIS — K640 First degree hemorrhoids: Secondary | ICD-10-CM | POA: Diagnosis not present

## 2024-08-26 ENCOUNTER — Other Ambulatory Visit: Payer: Self-pay | Admitting: Orthopedic Surgery

## 2024-08-26 DIAGNOSIS — G8929 Other chronic pain: Secondary | ICD-10-CM

## 2024-08-26 DIAGNOSIS — Z96642 Presence of left artificial hip joint: Secondary | ICD-10-CM

## 2024-09-01 ENCOUNTER — Ambulatory Visit
Admission: RE | Admit: 2024-09-01 | Discharge: 2024-09-01 | Disposition: A | Discharge status: Home / Self Care | Source: Ambulatory Visit | Attending: Orthopedic Surgery | Admitting: Orthopedic Surgery

## 2024-09-01 DIAGNOSIS — G8929 Other chronic pain: Secondary | ICD-10-CM | POA: Diagnosis present

## 2024-09-01 DIAGNOSIS — Z96642 Presence of left artificial hip joint: Secondary | ICD-10-CM | POA: Diagnosis present

## 2024-09-01 DIAGNOSIS — M25552 Pain in left hip: Secondary | ICD-10-CM | POA: Diagnosis present

## 2024-11-11 ENCOUNTER — Ambulatory Visit: Admitting: Physician Assistant
